# Patient Record
Sex: Female | Born: 1950 | Race: White | Hispanic: No | Marital: Married | Smoking: Former smoker
Health system: Southern US, Community
[De-identification: ages and names within clinical notes are randomized; demographics above are authoritative.]

## PROBLEM LIST (undated history)

## (undated) DIAGNOSIS — F329 Major depressive disorder, single episode, unspecified: Secondary | ICD-10-CM

## (undated) DIAGNOSIS — F32A Depression, unspecified: Secondary | ICD-10-CM

## (undated) DIAGNOSIS — E538 Deficiency of other specified B group vitamins: Secondary | ICD-10-CM

## (undated) DIAGNOSIS — E559 Vitamin D deficiency, unspecified: Secondary | ICD-10-CM

## (undated) DIAGNOSIS — M1990 Unspecified osteoarthritis, unspecified site: Secondary | ICD-10-CM

## (undated) DIAGNOSIS — W19XXXA Unspecified fall, initial encounter: Secondary | ICD-10-CM

## (undated) DIAGNOSIS — G43009 Migraine without aura, not intractable, without status migrainosus: Secondary | ICD-10-CM

## (undated) DIAGNOSIS — R269 Unspecified abnormalities of gait and mobility: Secondary | ICD-10-CM

## (undated) DIAGNOSIS — Z860100 Personal history of colon polyps, unspecified: Secondary | ICD-10-CM

## (undated) DIAGNOSIS — E785 Hyperlipidemia, unspecified: Secondary | ICD-10-CM

## (undated) DIAGNOSIS — F419 Anxiety disorder, unspecified: Secondary | ICD-10-CM

## (undated) DIAGNOSIS — I10 Essential (primary) hypertension: Secondary | ICD-10-CM

## (undated) DIAGNOSIS — K219 Gastro-esophageal reflux disease without esophagitis: Secondary | ICD-10-CM

## (undated) DIAGNOSIS — J449 Chronic obstructive pulmonary disease, unspecified: Secondary | ICD-10-CM

## (undated) DIAGNOSIS — G458 Other transient cerebral ischemic attacks and related syndromes: Secondary | ICD-10-CM

## (undated) DIAGNOSIS — Z800 Family history of malignant neoplasm of digestive organs: Secondary | ICD-10-CM

## (undated) DIAGNOSIS — R42 Dizziness and giddiness: Secondary | ICD-10-CM

## (undated) DIAGNOSIS — I671 Cerebral aneurysm, nonruptured: Secondary | ICD-10-CM

## (undated) HISTORY — DX: Personal history of colon polyps, unspecified: Z86.0100

## (undated) HISTORY — DX: Hyperlipidemia, unspecified: E78.5

## (undated) HISTORY — PX: KNEE ARTHROSCOPY: SHX127

## (undated) HISTORY — DX: Essential (primary) hypertension: I10

## (undated) HISTORY — DX: Unspecified fall, initial encounter: W19.XXXA

## (undated) HISTORY — PX: BRAIN SURGERY: SHX531

## (undated) HISTORY — DX: Gastro-esophageal reflux disease without esophagitis: K21.9

## (undated) HISTORY — DX: Family history of malignant neoplasm of digestive organs: Z80.0

## (undated) HISTORY — DX: Deficiency of other specified B group vitamins: E53.8

## (undated) HISTORY — DX: Personal history of colonic polyps: Z86.010

## (undated) HISTORY — DX: Migraine without aura, not intractable, without status migrainosus: G43.009

## (undated) HISTORY — DX: Unspecified abnormalities of gait and mobility: R26.9

## (undated) HISTORY — DX: Unspecified osteoarthritis, unspecified site: M19.90

## (undated) HISTORY — DX: Vitamin D deficiency, unspecified: E55.9

## (undated) HISTORY — DX: Dizziness and giddiness: R42

## (undated) HISTORY — DX: Cerebral aneurysm, nonruptured: I67.1

## (undated) HISTORY — DX: Other transient cerebral ischemic attacks and related syndromes: G45.8

---
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
## 1985-08-26 HISTORY — PX: TUBAL LIGATION: SHX77

## 1999-07-06 ENCOUNTER — Ambulatory Visit (HOSPITAL_COMMUNITY): Admission: RE | Admit: 1999-07-06 | Discharge: 1999-07-06 | Payer: Self-pay | Admitting: Cardiology

## 2005-12-16 ENCOUNTER — Ambulatory Visit (HOSPITAL_BASED_OUTPATIENT_CLINIC_OR_DEPARTMENT_OTHER): Admission: RE | Admit: 2005-12-16 | Discharge: 2005-12-16 | Payer: Self-pay | Admitting: Orthopedic Surgery

## 2006-08-26 HISTORY — PX: SHOULDER SURGERY: SHX246

## 2007-08-27 HISTORY — PX: WRIST SURGERY: SHX841

## 2007-08-27 HISTORY — PX: OTHER SURGICAL HISTORY: SHX169

## 2007-11-06 ENCOUNTER — Ambulatory Visit (HOSPITAL_BASED_OUTPATIENT_CLINIC_OR_DEPARTMENT_OTHER): Admission: RE | Admit: 2007-11-06 | Discharge: 2007-11-06 | Payer: Self-pay | Admitting: Orthopedic Surgery

## 2009-06-05 ENCOUNTER — Encounter: Payer: Self-pay | Admitting: Unknown Physician Specialty

## 2009-06-22 ENCOUNTER — Encounter: Admission: RE | Admit: 2009-06-22 | Discharge: 2009-06-22 | Payer: Self-pay | Admitting: Otolaryngology

## 2009-06-26 ENCOUNTER — Ambulatory Visit (HOSPITAL_COMMUNITY): Admission: RE | Admit: 2009-06-26 | Discharge: 2009-06-26 | Payer: Self-pay | Admitting: Interventional Radiology

## 2009-07-17 ENCOUNTER — Ambulatory Visit (HOSPITAL_COMMUNITY): Admission: RE | Admit: 2009-07-17 | Discharge: 2009-07-17 | Payer: Self-pay | Admitting: Interventional Radiology

## 2009-08-14 ENCOUNTER — Ambulatory Visit (HOSPITAL_COMMUNITY): Admission: RE | Admit: 2009-08-14 | Discharge: 2009-08-14 | Payer: Self-pay | Admitting: Interventional Radiology

## 2009-09-06 ENCOUNTER — Inpatient Hospital Stay (HOSPITAL_COMMUNITY): Admission: RE | Admit: 2009-09-06 | Discharge: 2009-09-06 | Payer: Self-pay | Admitting: Interventional Radiology

## 2009-09-20 ENCOUNTER — Encounter: Payer: Self-pay | Admitting: Interventional Radiology

## 2009-10-31 ENCOUNTER — Ambulatory Visit (HOSPITAL_COMMUNITY): Admission: RE | Admit: 2009-10-31 | Discharge: 2009-10-31 | Payer: Self-pay | Admitting: Interventional Radiology

## 2010-09-16 ENCOUNTER — Encounter: Payer: Self-pay | Admitting: Otolaryngology

## 2010-09-16 ENCOUNTER — Encounter: Payer: Self-pay | Admitting: Interventional Radiology

## 2010-11-11 LAB — BASIC METABOLIC PANEL
BUN: 13 mg/dL (ref 6–23)
Chloride: 103 mEq/L (ref 96–112)
GFR calc non Af Amer: >60 mL/min (ref >60)
Glucose, Bld: 95 mg/dL (ref 70–99)
Potassium: 3.8 mEq/L (ref 3.5–5.1)
Sodium: 139 mEq/L (ref 135–145)

## 2010-11-11 LAB — APTT: aPTT: 25 seconds (ref 24–37)

## 2010-11-11 LAB — DIFFERENTIAL
Eosinophils Relative: 11 % — ABNORMAL HIGH (ref 0–5)
Lymphocytes Relative: 30 % (ref 12–46)
Monocytes Absolute: 0.4 10*3/uL (ref 0.1–1.0)
Monocytes Relative: 7 % (ref 3–12)
Neutro Abs: 2.8 10*3/uL (ref 1.7–7.7)

## 2010-11-11 LAB — BRAIN NATRIURETIC PEPTIDE: Pro B Natriuretic peptide (BNP): <30 pg/mL (ref 0.0–100.0)

## 2010-11-11 LAB — CBC
HCT: 37.9 % (ref 36.0–46.0)
Hemoglobin: 13.2 g/dL (ref 12.0–15.0)
MCHC: 34.7 g/dL (ref 30.0–36.0)
RBC: 3.99 MIL/uL (ref 3.87–5.11)

## 2010-11-18 LAB — CBC
HCT: 40.5 % (ref 36.0–46.0)
Hemoglobin: 13.9 g/dL (ref 12.0–15.0)
MCV: 95.2 fL (ref 78.0–100.0)
RBC: 4.26 MIL/uL (ref 3.87–5.11)
WBC: 6.8 10*3/uL (ref 4.0–10.5)

## 2010-11-18 LAB — DIFFERENTIAL
Eosinophils Absolute: 0.8 10*3/uL — ABNORMAL HIGH (ref 0.0–0.7)
Eosinophils Relative: 11 % — ABNORMAL HIGH (ref 0–5)
Lymphs Abs: 2.6 10*3/uL (ref 0.7–4.0)
Monocytes Absolute: 0.5 10*3/uL (ref 0.1–1.0)
Monocytes Relative: 7 % (ref 3–12)

## 2010-11-18 LAB — BASIC METABOLIC PANEL
CO2: 27 mEq/L (ref 19–32)
Chloride: 104 mEq/L (ref 96–112)
GFR calc Af Amer: >60 mL/min (ref >60)
Potassium: 3.7 mEq/L (ref 3.5–5.1)

## 2010-11-18 LAB — PROTIME-INR: INR: 1.01 (ref 0.00–1.49)

## 2010-11-26 LAB — DIFFERENTIAL
Basophils Relative: 1 % (ref 0–1)
Lymphocytes Relative: 33 % (ref 12–46)
Monocytes Absolute: 0.6 10*3/uL (ref 0.1–1.0)
Monocytes Relative: 9 % (ref 3–12)
Neutro Abs: 3.2 10*3/uL (ref 1.7–7.7)

## 2010-11-26 LAB — BASIC METABOLIC PANEL
CO2: 29 mEq/L (ref 19–32)
Chloride: 107 mEq/L (ref 96–112)
GFR calc non Af Amer: >60 mL/min (ref >60)
Glucose, Bld: 97 mg/dL (ref 70–99)
Potassium: 3.5 mEq/L (ref 3.5–5.1)
Sodium: 142 mEq/L (ref 135–145)

## 2010-11-26 LAB — APTT: aPTT: 25 seconds (ref 24–37)

## 2010-11-26 LAB — CBC
HCT: 38.6 % (ref 36.0–46.0)
Hemoglobin: 13.1 g/dL (ref 12.0–15.0)
MCHC: 34.1 g/dL (ref 30.0–36.0)
RBC: 4.04 MIL/uL (ref 3.87–5.11)

## 2010-11-28 LAB — BASIC METABOLIC PANEL
CO2: 26 mEq/L (ref 19–32)
Calcium: 9.4 mg/dL (ref 8.4–10.5)
Chloride: 108 mEq/L (ref 96–112)
Creatinine, Ser: 0.94 mg/dL (ref 0.4–1.2)
GFR calc Af Amer: >60 mL/min (ref >60)
GFR calc Af Amer: >60 mL/min (ref >60)
GFR calc non Af Amer: >60 mL/min (ref >60)
Potassium: 3.6 mEq/L (ref 3.5–5.1)
Sodium: 140 mEq/L (ref 135–145)

## 2010-11-28 LAB — DIFFERENTIAL
Eosinophils Absolute: 0.4 10*3/uL (ref 0.0–0.7)
Eosinophils Relative: 6 % — ABNORMAL HIGH (ref 0–5)
Lymphocytes Relative: 37 % (ref 12–46)
Lymphocytes Relative: 27 % (ref 12–46)
Lymphs Abs: 2.7 10*3/uL (ref 0.7–4.0)
Lymphs Abs: 2.6 10*3/uL (ref 0.7–4.0)
Monocytes Absolute: 0.5 10*3/uL (ref 0.1–1.0)
Monocytes Relative: 6 % (ref 3–12)
Neutro Abs: 6.1 10*3/uL (ref 1.7–7.7)
Neutrophils Relative %: 63 % (ref 43–77)

## 2010-11-28 LAB — BRAIN NATRIURETIC PEPTIDE
Pro B Natriuretic peptide (BNP): <30 pg/mL (ref 0.0–100.0)
Pro B Natriuretic peptide (BNP): <30 pg/mL (ref 0.0–100.0)

## 2010-11-28 LAB — PROTIME-INR
INR: 1.11 (ref 0.00–1.49)
Prothrombin Time: 14.2 seconds (ref 11.6–15.2)

## 2010-11-28 LAB — CBC
HCT: 38.7 % (ref 36.0–46.0)
Hemoglobin: 13.6 g/dL (ref 12.0–15.0)
Platelets: 326 10*3/uL (ref 150–400)
RBC: 3.87 MIL/uL (ref 3.87–5.11)
WBC: 7.3 10*3/uL (ref 4.0–10.5)
WBC: 9.7 10*3/uL (ref 4.0–10.5)

## 2010-11-28 LAB — APTT: aPTT: 27 seconds (ref 24–37)

## 2010-12-25 ENCOUNTER — Other Ambulatory Visit (HOSPITAL_COMMUNITY): Payer: Self-pay | Admitting: Interventional Radiology

## 2010-12-25 DIAGNOSIS — I729 Aneurysm of unspecified site: Secondary | ICD-10-CM

## 2011-01-08 NOTE — Op Note (Signed)
NAME:  Amy Glover, Amy Glover               ACCOUNT NO.:  [id number]   MEDICAL RECORD NO.:  [id number]          PATIENT TYPE:  AMB   LOCATION:  DSC                          FACILITY:  [facility]   PHYSICIAN:  Eulas Post, MD    DATE OF BIRTH:  [birth year]   DATE OF PROCEDURE:  11/06/2007  DATE OF DISCHARGE:                               OPERATIVE REPORT   ATTENDING PHYSICIAN:  Eulas Post, MD   PREOPERATIVE DIAGNOSIS:  Right distal radius fracture.   POSTOPERATIVE DIAGNOSIS:  Right distal radius fracture.   OPERATIVE PROCEDURE:  Open reduction internal fixation of the right  distal radius fracture with a total of three fragments.   ANESTHESIA:  Anesthesia is general.   TOURNIQUET TIME:  Tourniquet time was 80 minutes.   ESTIMATED BLOOD LOSS:  Estimated blood loss was minimal.   OPERATIVE IMPLANTS:  [employer] plate with two proximal screws and  screws in the proximal row of the distal head of the plate only.   PREOPERATIVE INDICATIONS:  Ms. Amy Glover is a 60 year old woman who  fell and broke her right wrist.  She recently had adhesive capsulitis of  the left shoulder and had a manipulation.  We discussed the risks,  benefits and alternatives to operative versus nonoperative care.  She  had a displaced distal radius fracture.  In order to minimize the risks  of post-traumatic arthritis and malunion she elected to have surgery.  We discussed percutaneous pinning versus ORIF.  Given her recent bout of  adhesive capsulitis and her tendency towards stiffness, we elected to  perform an open reduction internal fixation in order to optimize her  early motion and reduce the risk of post traumatic stiffness.  The  risks, benefits and alternatives were discussed with her preoperatively  including but not limited to the risks of infection, bleeding, nerve  injury, malunion, nonunion, hardware prominence, hardware failure,  tendon rupture, post-traumatic arthritis, cardiopulmonary  complications,  among others.  She was willing to proceed.   OPERATIVE FINDINGS:  The radial styloid was fractured and dorsally  displaced.  There was a larger ulnar piece.  We were able to restore  appropriate congruity.  The plate had excellent congruity with the bone  and we had an excellent hold on the radial styloid using a radial screw.  The plate fit best slightly distal to its normal position, therefore, we  filled the proximal holes and did not fill the distal holes.  Live  fluoroscopy was used to confirm that there were no screws left in the  joint.   PROCEDURE IN DETAIL:  The patient was brought to the operating room and  placed in the supine position.  One gram of intravenous Ancef was given.  The right upper extremity was prepped and draped in the usual sterile  fashion.  General anesthesia was administered.  A volar approach to the  wrist was performed.  Fracture fragments were reduced and held with K-  wires.  The plate was applied and then the radial styloid screw was  placed.  The proximal holes were also used.  The  middle hole of the  proximal portion of the plate had very poor bite for some reason,  therefore we did not use that hole.  The other two screws had excellent  hold.  We were able to restore near anatomic congruity to the volar tilt  of the radius and secured it with a combination of pegs and screws.   We then irrigated the wound copiously and closed the pronator quadratus  using 3-0 Vicryl followed by 3-0 for the subcutaneous tissue followed by  Monocryl for the skin.  Steri-Strips were placed.  Sterile gauze was  also placed and a short-arm splint.  The patient was awakened, returned  to the PACU in stable and satisfactory condition.  She is going to come  back to see [place] in 2 weeks.  We will get x-rays out of plaster at that  time and hopefully allow her to begin doing some early motion.      Eulas Post, MD  Electronically Signed      JPL/MEDQ  D:  11/06/2007  T:  11/07/2007  Job:  [id number]

## 2011-01-11 NOTE — Op Note (Signed)
NAMEGREGG, HOLSTER               ACCOUNT NO.:  [id number]   MEDICAL RECORD NO.:  [id number]          PATIENT TYPE:  AMB   LOCATION:  DSC                          FACILITY:  [facility]   PHYSICIAN:  Robert A. Thurston Hole, M.D. DATE OF BIRTH:  19-Jul-1951   DATE OF PROCEDURE:  12/16/2005  DATE OF DISCHARGE:                                 OPERATIVE REPORT   PREOPERATIVE DIAGNOSES:  1.  Right knee medial and lateral meniscal tears.  2.  Right knee chondromalacia.  3.  Right knee loose body.  4.  Right knee anterior compartment ganglion cyst.   POSTOPERATIVE DIAGNOSES:  1.  Right knee medial and lateral meniscal tears.  2.  Right knee chondromalacia.  3.  Right knee loose body.  4.  Right knee anterior compartment ganglion cyst.   PROCEDURES:  1.  Right knee EUA, followed by arthroscopic partial medial and lateral      meniscectomies.  2.  Right knee loose body excision.  3.  Right knee ganglion cyst debridement and excision.  4.  Right knee chondroplasty.   SURGEON:  Elana Alm. Thurston Hole, M.D.   ASSISTANT:  Aura Fey. Bobbe Medico.   ANESTHESIA:  General.   OPERATIVE TIME:  45 minutes.   COMPLICATIONS:  None.   INDICATIONS FOR PROCEDURE:  Ms. Janowski is a 54-year woman who has had  significant right knee pain for the past month, getting progressively worse  with locking and catching, with exam and MRI documenting lateral meniscus  tear with loose body chondromalacia and an anterior compartment ganglion  cyst. She has failed conservative care and is now to undergo arthroscopy.   DESCRIPTION:  Ms. Oguinn was brought to operating room on December 16, 2005  and placed on the operative table in the supine position. After an adequate  level general anesthesia was obtained, her right knee was examined. She had  full range of motion, and her knee was stable. Ligamentous exam with normal  patellar tracking. The knee was sterilely injected with 0.25% Marcaine with  epinephrine. She received Ancef 1  g IV preoperatively for prophylaxis. Her  right leg was then prepped using sterile DuraPrep and draped using sterile  technique. Originally through an anterolateral portal, the arthroscope with  a pump attached was placed into an anteromedial portal.  An arthroscopic  probe was placed. On initial inspection of the medial compartment, she had  25% grade 3 chondromalacia which was debrided. Medial meniscus tear, 20%,  posteromedial horn, which was resected back to stable rim. The intercondylar  notch was inspected. Anterior and posterior cruciate ligaments were normal.  There was a thickened ganglion-type cystic material noted in the anterior  compartment in the fat pad, and this was thoroughly debrided and resected  with a shaver. Lateral compartment was inspected. She had 75% grade 3  chondromalacia, which was debrided. She had a bucket-handle type tear of the  posterolateral horn of the lateral meniscus, of which 75% was resected back  to a stable rim. Patellofemoral joint inspected, 30-40% grade 3  chondromalacia on the patellofemoral groove, and this was debrided. Moderate  synovitis  in the medial and lateral gutters was debrided. A lateral gutter  loose body was noted. It was removed. It measured 1 x 1 cm. No other loose  bodies or pathology was noted in the medial and lateral compartments. At  this point, it was felt that all the pathology had been satisfactorily  addressed. The instruments were removed. Portals were closed with 3-0 nylon  suture and injected with 0.25% Marcaine with epinephrine and 4 mg of  morphine. Sterile dressings were applied, and the patient awakened and taken  to the recovery room in a stable condition.   FOLLOW-UP CARE:  She will be treated as an outpatient on Vicodin and  ibuprofen. She will be back in the office in a week for sutures out and  followup.      Robert A. Thurston Hole, M.D.  Electronically Signed     RAW/MEDQ  D:  12/16/2005  T:  12/17/2005   Job:  [id number]

## 2011-01-15 ENCOUNTER — Other Ambulatory Visit (HOSPITAL_COMMUNITY): Payer: Self-pay | Admitting: Interventional Radiology

## 2011-01-15 ENCOUNTER — Ambulatory Visit (HOSPITAL_COMMUNITY)
Admission: RE | Admit: 2011-01-15 | Discharge: 2011-01-15 | Disposition: A | Discharge status: Home / Self Care | Payer: PRIVATE HEALTH INSURANCE | Source: Ambulatory Visit | Attending: Interventional Radiology | Admitting: Interventional Radiology

## 2011-01-15 DIAGNOSIS — G43909 Migraine, unspecified, not intractable, without status migrainosus: Secondary | ICD-10-CM | POA: Insufficient documentation

## 2011-01-15 DIAGNOSIS — R42 Dizziness and giddiness: Secondary | ICD-10-CM | POA: Insufficient documentation

## 2011-01-15 DIAGNOSIS — I10 Essential (primary) hypertension: Secondary | ICD-10-CM | POA: Insufficient documentation

## 2011-01-15 DIAGNOSIS — M1990 Unspecified osteoarthritis, unspecified site: Secondary | ICD-10-CM | POA: Insufficient documentation

## 2011-01-15 DIAGNOSIS — I428 Other cardiomyopathies: Secondary | ICD-10-CM | POA: Insufficient documentation

## 2011-01-15 DIAGNOSIS — K219 Gastro-esophageal reflux disease without esophagitis: Secondary | ICD-10-CM | POA: Insufficient documentation

## 2011-01-15 DIAGNOSIS — I671 Cerebral aneurysm, nonruptured: Secondary | ICD-10-CM | POA: Insufficient documentation

## 2011-01-15 DIAGNOSIS — I729 Aneurysm of unspecified site: Secondary | ICD-10-CM

## 2011-01-15 LAB — POCT I-STAT, CHEM 8
Creatinine, Ser: 1.2 mg/dL (ref 0.4–1.2)
Glucose, Bld: 96 mg/dL (ref 70–99)
HCT: 39 % (ref 36.0–46.0)
Hemoglobin: 13.3 g/dL (ref 12.0–15.0)
Potassium: 3.3 mEq/L — ABNORMAL LOW (ref 3.5–5.1)
Sodium: 142 mEq/L (ref 135–145)
TCO2: 25 mmol/L (ref 0–100)

## 2011-01-15 LAB — CBC
Hemoglobin: 12.6 g/dL (ref 12.0–15.0)
MCH: 31.4 pg (ref 26.0–34.0)
Platelets: 291 10*3/uL (ref 150–400)
RBC: 4.01 MIL/uL (ref 3.87–5.11)

## 2011-01-15 LAB — PROTIME-INR
INR: 0.97 (ref 0.00–1.49)
Prothrombin Time: 13.1 seconds (ref 11.6–15.2)

## 2011-01-15 MED ORDER — IOHEXOL 300 MG/ML  SOLN
150.0000 mL | Freq: Once | INTRAMUSCULAR | Status: AC | PRN
  Administered 2011-01-15: 65 mL via INTRAVENOUS

## 2011-05-20 LAB — POCT I-STAT, CHEM 8
Calcium, Ion: 1.15
Glucose, Bld: 100 — ABNORMAL HIGH
HCT: 41
Hemoglobin: 13.9
Potassium: 4

## 2011-12-17 ENCOUNTER — Telehealth (HOSPITAL_COMMUNITY): Payer: Self-pay

## 2011-12-17 ENCOUNTER — Other Ambulatory Visit (HOSPITAL_COMMUNITY): Payer: Self-pay | Admitting: Interventional Radiology

## 2011-12-17 DIAGNOSIS — I729 Aneurysm of unspecified site: Secondary | ICD-10-CM

## 2011-12-17 NOTE — Telephone Encounter (Signed)
Spoke with Mr. Fazzino about scheduling Mrs. Amy Glover f/u angio appt.  He stated that he would have her call to schedule.

## 2012-01-03 ENCOUNTER — Encounter (HOSPITAL_COMMUNITY): Payer: Self-pay | Admitting: Pharmacy Technician

## 2012-01-03 ENCOUNTER — Other Ambulatory Visit: Payer: Self-pay | Admitting: Radiology

## 2012-01-10 ENCOUNTER — Ambulatory Visit (HOSPITAL_COMMUNITY)
Admission: RE | Admit: 2012-01-10 | Discharge: 2012-01-10 | Disposition: A | Discharge status: Home / Self Care | Payer: PRIVATE HEALTH INSURANCE | Source: Ambulatory Visit | Attending: Interventional Radiology | Admitting: Interventional Radiology

## 2012-01-10 ENCOUNTER — Encounter (HOSPITAL_COMMUNITY): Payer: Self-pay

## 2012-01-10 ENCOUNTER — Other Ambulatory Visit (HOSPITAL_COMMUNITY): Payer: Self-pay | Admitting: Interventional Radiology

## 2012-01-10 DIAGNOSIS — I729 Aneurysm of unspecified site: Secondary | ICD-10-CM

## 2012-01-10 DIAGNOSIS — R209 Unspecified disturbances of skin sensation: Secondary | ICD-10-CM | POA: Insufficient documentation

## 2012-01-10 DIAGNOSIS — I671 Cerebral aneurysm, nonruptured: Secondary | ICD-10-CM | POA: Insufficient documentation

## 2012-01-10 DIAGNOSIS — R51 Headache: Secondary | ICD-10-CM | POA: Insufficient documentation

## 2012-01-10 DIAGNOSIS — E785 Hyperlipidemia, unspecified: Secondary | ICD-10-CM | POA: Insufficient documentation

## 2012-01-10 DIAGNOSIS — I10 Essential (primary) hypertension: Secondary | ICD-10-CM | POA: Insufficient documentation

## 2012-01-10 LAB — DIFFERENTIAL
Basophils Relative: 1 % (ref 0–1)
Lymphs Abs: 3 10*3/uL (ref 0.7–4.0)
Monocytes Relative: 8 % (ref 3–12)
Neutro Abs: 2.1 10*3/uL (ref 1.7–7.7)
Neutrophils Relative %: 34 % — ABNORMAL LOW (ref 43–77)

## 2012-01-10 LAB — CBC
Hemoglobin: 13.3 g/dL (ref 12.0–15.0)
MCHC: 34.3 g/dL (ref 30.0–36.0)
Platelets: 265 10*3/uL (ref 150–400)
RBC: 4.09 MIL/uL (ref 3.87–5.11)

## 2012-01-10 LAB — PROTIME-INR
INR: 0.91 (ref 0.00–1.49)
Prothrombin Time: 12.4 seconds (ref 11.6–15.2)

## 2012-01-10 LAB — BASIC METABOLIC PANEL
GFR calc Af Amer: 84 mL/min — ABNORMAL LOW (ref >90)
GFR calc non Af Amer: 73 mL/min — ABNORMAL LOW (ref >90)
Potassium: 4 mEq/L (ref 3.5–5.1)
Sodium: 143 mEq/L (ref 135–145)

## 2012-01-10 MED ORDER — SODIUM CHLORIDE 0.9 % IV SOLN
Freq: Once | INTRAVENOUS | Status: AC
  Administered 2012-01-10: 07:00:00 via INTRAVENOUS

## 2012-01-10 MED ORDER — FENTANYL CITRATE 0.05 MG/ML IJ SOLN
INTRAMUSCULAR | Status: AC | PRN
  Administered 2012-01-10 (×2): 25 ug via INTRAVENOUS

## 2012-01-10 MED ORDER — IOHEXOL 300 MG/ML  SOLN
150.0000 mL | Freq: Once | INTRAMUSCULAR | Status: AC | PRN
  Administered 2012-01-10: 50 mL via INTRA_ARTERIAL

## 2012-01-10 MED ORDER — HYDRALAZINE HCL 20 MG/ML IJ SOLN
INTRAMUSCULAR | Status: AC
  Filled 2012-01-10: qty 1

## 2012-01-10 MED ORDER — MIDAZOLAM HCL 2 MG/2ML IJ SOLN
INTRAMUSCULAR | Status: AC
  Filled 2012-01-10: qty 4

## 2012-01-10 MED ORDER — HEPARIN SOD (PORK) LOCK FLUSH 100 UNIT/ML IV SOLN
INTRAVENOUS | Status: AC | PRN
  Administered 2012-01-10 (×2): 500 [IU] via INTRAVENOUS

## 2012-01-10 MED ORDER — HYDROCODONE-ACETAMINOPHEN 5-325 MG PO TABS
1.0000 | ORAL_TABLET | ORAL | Status: DC | PRN
  Administered 2012-01-10: 1 via ORAL
  Filled 2012-01-10: qty 1

## 2012-01-10 MED ORDER — MIDAZOLAM HCL 5 MG/5ML IJ SOLN
INTRAMUSCULAR | Status: AC | PRN
  Administered 2012-01-10 (×2): 1 mg via INTRAVENOUS

## 2012-01-10 MED ORDER — FENTANYL CITRATE 0.05 MG/ML IJ SOLN
INTRAMUSCULAR | Status: AC
  Filled 2012-01-10: qty 4

## 2012-01-10 NOTE — Discharge Instructions (Signed)

## 2012-01-10 NOTE — H&P (Signed)
Chief Complaint: Hx ACA aneurysm HPI: Amy Glover is an 61 y.o. female who has had open clipping of ACA aneurysm in [place]. She has had follow up angiograms, most recently last May, showing stability of this. She is here for her annual follow up angio.  No new c/o. Taking ASA, no recent TIA< CVA sxs. No new headaches.  Past Medical History: ACA aneurysm, HTN, hyperlipidemia, allergic rhinitis  Past Surgical History: Open ACA aneurysm clipping  Family History: No family history on file.  Social History:No tobacco use.  Allergies:  Allergies  Allergen Reactions  . Sulfa Antibiotics Hives    Medications: Xanax, Coreg, ASA 81, Lasix, Allegra, Prevacid, Pravachol, Prinivil, Ambien, Kdur, MVI, Os Cal.  Please HPI for pertinent positives, otherwise complete 10 system ROS negative.  Physical Exam: Blood pressure 122/66, pulse 83, temperature 97.7 F (36.5 C), temperature source Oral, resp. rate 18, height 5\' 4"  (1.626 m), weight 178 lb (80.74 kg), SpO2 96.00%. Body mass index is 30.55 kg/(m^2).   General Appearance:  Alert, cooperative, no distress, appears stated age  Head:  Normocephalic, without obvious abnormality, atraumatic  ENT: Unremarkable  Neck: Supple, symmetrical, trachea midline, no adenopathy, thyroid: not enlarged, symmetric, no tenderness/mass/nodules  Lungs:   Clear to auscultation bilaterally, no w/r/r, respirations unlabored without use of accessory muscles.  Heart:  Regular rate and rhythm, S1, S2 normal, no murmur, rub or gallop. Carotids 2+ without bruit.  Abdomen:   Soft, non-tender, non distended. Bowel sounds active all four quadrants,  no masses, no organomegaly.  Extremities: Extremities normal, atraumatic, no cyanosis or edema  Pulses: 2+ and symmetric  Neurologic: Normal affect, no gross deficits.   Results for orders placed during the hospital encounter of 01/10/12 (from the past 48 hour(s))  APTT     Status: Normal   Collection Time   01/10/12  7:13  AM      Component Value Range Comment   aPTT 24  24 - 37 (seconds)   BASIC METABOLIC PANEL     Status: Abnormal   Collection Time   01/10/12  7:13 AM      Component Value Range Comment   Sodium 143  135 - 145 (mEq/L)    Potassium 4.0  3.5 - 5.1 (mEq/L)    Chloride 105  96 - 112 (mEq/L)    CO2 27  19 - 32 (mEq/L)    Glucose, Bld 111 (*) 70 - 99 (mg/dL)    BUN 23  6 - 23 (mg/dL)    Creatinine, Ser 1.61  0.50 - 1.10 (mg/dL)    Calcium 9.9  8.4 - 10.5 (mg/dL)    GFR calc non Af Amer 73 (*) >90 (mL/min)    GFR calc Af Amer 84 (*) >90 (mL/min)   CBC     Status: Normal   Collection Time   01/10/12  7:13 AM      Component Value Range Comment   WBC 6.3  4.0 - 10.5 (K/uL)    RBC 4.09  3.87 - 5.11 (MIL/uL)    Hemoglobin 13.3  12.0 - 15.0 (g/dL)    HCT 09.6  04.5 - 40.9 (%)    MCV 94.9  78.0 - 100.0 (fL)    MCH 32.5  26.0 - 34.0 (pg)    MCHC 34.3  30.0 - 36.0 (g/dL)    RDW 81.1  91.4 - 78.2 (%)    Platelets 265  150 - 400 (K/uL)   DIFFERENTIAL     Status: Abnormal  Collection Time   01/10/12  7:13 AM      Component Value Range Comment   Neutrophils Relative 34 (*) 43 - 77 (%)    Neutro Abs 2.1  1.7 - 7.7 (K/uL)    Lymphocytes Relative 48 (*) 12 - 46 (%)    Lymphs Abs 3.0  0.7 - 4.0 (K/uL)    Monocytes Relative 8  3 - 12 (%)    Monocytes Absolute 0.5  0.1 - 1.0 (K/uL)    Eosinophils Relative 9 (*) 0 - 5 (%)    Eosinophils Absolute 0.6  0.0 - 0.7 (K/uL)    Basophils Relative 1  0 - 1 (%)    Basophils Absolute 0.1  0.0 - 0.1 (K/uL)   PROTIME-INR     Status: Normal   Collection Time   01/10/12  7:13 AM      Component Value Range Comment   Prothrombin Time 12.4  11.6 - 15.2 (seconds)    INR 0.91  0.00 - 1.49     No results found.  Assessment/Plan Hx of ACA aneurysm s/p clipping. For folow up cerebral arteriogram today. Procedure reviewed with pt and husband including risks and complications. Labs reviewed Consent signed,.  Brayton El PA-C 01/10/2012, 8:13 AM

## 2012-01-10 NOTE — Procedures (Signed)
S/P bilateral carotid and Lt vert arteriogram. RT CFA approach. Preliminary findings.. 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

## 2012-01-13 ENCOUNTER — Telehealth (HOSPITAL_COMMUNITY): Payer: Self-pay | Admitting: *Deleted

## 2012-01-13 NOTE — Telephone Encounter (Signed)
Doing well no problems, groin doing well without redness.  Encouraged to call for problems.

## 2012-10-15 DIAGNOSIS — G5700 Lesion of sciatic nerve, unspecified lower limb: Secondary | ICD-10-CM | POA: Insufficient documentation

## 2013-08-26 HISTORY — PX: OTHER SURGICAL HISTORY: SHX169

## 2013-12-03 ENCOUNTER — Encounter: Payer: Self-pay | Admitting: Neurology

## 2013-12-07 ENCOUNTER — Telehealth: Payer: Self-pay | Admitting: Neurology

## 2013-12-07 ENCOUNTER — Ambulatory Visit (INDEPENDENT_AMBULATORY_CARE_PROVIDER_SITE_OTHER): Payer: Medicare Other | Admitting: Radiology

## 2013-12-07 ENCOUNTER — Ambulatory Visit (INDEPENDENT_AMBULATORY_CARE_PROVIDER_SITE_OTHER): Payer: Medicare Other

## 2013-12-07 ENCOUNTER — Encounter (INDEPENDENT_AMBULATORY_CARE_PROVIDER_SITE_OTHER): Payer: Self-pay

## 2013-12-07 ENCOUNTER — Ambulatory Visit (INDEPENDENT_AMBULATORY_CARE_PROVIDER_SITE_OTHER): Payer: Medicare Other | Admitting: Neurology

## 2013-12-07 ENCOUNTER — Encounter: Payer: Self-pay | Admitting: Neurology

## 2013-12-07 VITALS — BP 108/70 | HR 68 | Ht 63.5 in | Wt 196.0 lb

## 2013-12-07 DIAGNOSIS — G43009 Migraine without aura, not intractable, without status migrainosus: Secondary | ICD-10-CM

## 2013-12-07 DIAGNOSIS — R42 Dizziness and giddiness: Secondary | ICD-10-CM

## 2013-12-07 HISTORY — DX: Migraine without aura, not intractable, without status migrainosus: G43.009

## 2013-12-07 HISTORY — DX: Dizziness and giddiness: R42

## 2013-12-07 NOTE — Patient Instructions (Signed)
Vertigo Vertigo means you feel like you or your surroundings are moving when they are not. Vertigo can be dangerous if it occurs when you are at work, driving, or performing difficult activities.  CAUSES  Vertigo occurs when there is a conflict of signals sent to your brain from the visual and sensory systems in your body. There are many different causes of vertigo, including:  Infections, especially in the inner ear.  A bad reaction to a drug or misuse of alcohol and medicines.  Withdrawal from drugs or alcohol.  Rapidly changing positions, such as lying down or rolling over in bed.  A migraine headache.  Decreased blood flow to the brain.  Increased pressure in the brain from a head injury, infection, tumor, or bleeding. SYMPTOMS  You may feel as though the world is spinning around or you are falling to the ground. Because your balance is upset, vertigo can cause nausea and vomiting. You may have involuntary eye movements (nystagmus). DIAGNOSIS  Vertigo is usually diagnosed by physical exam. If the cause of your vertigo is unknown, your caregiver may perform imaging tests, such as an MRI scan (magnetic resonance imaging). TREATMENT  Most cases of vertigo resolve on their own, without treatment. Depending on the cause, your caregiver may prescribe certain medicines. If your vertigo is related to body position issues, your caregiver may recommend movements or procedures to correct the problem. In rare cases, if your vertigo is caused by certain inner ear problems, you may need surgery. HOME CARE INSTRUCTIONS   Follow your caregiver's instructions.  Avoid driving.  Avoid operating heavy machinery.  Avoid performing any tasks that would be dangerous to you or others during a vertigo episode.  Tell your caregiver if you notice that certain medicines seem to be causing your vertigo. Some of the medicines used to treat vertigo episodes can actually make them worse in some people. SEEK  IMMEDIATE MEDICAL CARE IF:   Your medicines do not relieve your vertigo or are making it worse.  You develop problems with talking, walking, weakness, or using your arms, hands, or legs.  You develop severe headaches.  Your nausea or vomiting continues or gets worse.  You develop visual changes.  A family member notices behavioral changes.  Your condition gets worse. MAKE SURE YOU:  Understand these instructions.  Will watch your condition.  Will get help right away if you are not doing well or get worse. Document Released: 05/22/2005 Document Revised: 11/04/2011 Document Reviewed: 02/28/2011 [employer] Patient Information 2014 [employer].  

## 2013-12-07 NOTE — Procedures (Signed)
     Amy Glover is a 64 year old patient with a history of dizziness and nausea sensations that have been quite chronic in nature. The episodes have been going on for several years, becoming more frequent. The patient is being evaluated for the dizziness. The patient does report some ringing in the right ear.  Brainstem auditory evoked response test was performed bilaterally using 85 dB rare fraction clicks in the ipsilateral ear, and 40 dB masking noise in the contralateral ear. Hearing thresholds 25 dB AS and 30 dB AD.  Description of study: The absolute latencies for waveforms I, III, and V were within normal limits on the left, with normal interpeak latencies for waveforms I-III, III-V, and I-V on this side. On the right side, waveform I was absent, with a normal absolute latency for waveform III, and prolongation for the absolute latency for waveform V. The interpeak latencies for waveforms I-III and I-V could not be calculated. The interpeak latency for waveform III-V was prolonged.  Impression: The brainstem auditory evoked response test on the left side was within normal limits. No evidence of peripheral or central conduction delay was seen. The study on the right side reveals absence of waveform I suggesting a peripheral conduction abnormality. Clinical correlation is required.

## 2013-12-07 NOTE — Telephone Encounter (Signed)
I called patient. The EEG study showed sharp transients emanating from the right temporal area. The patient also had a brainstem auditory response test showing absence of waveforms 1 on the right. I'll check MRI of the internal auditory canal. If this is unremarkable, I will consider treatment with Keppra or Topamax.

## 2013-12-07 NOTE — Procedures (Signed)
      Amy Glover is a 63 year old patient with a history of a right craniotomy for a aneurysm repair. The patient has had episodes of vertigo and nausea without headache. The patient is being evaluated for these events. The patient reports some issues with memory as well.  This is a routine EEG. A prior right craniotomy is noted. Medications include Xanax, aspirin, Lipitor, calcium carbonate, Coreg, vitamin D, Allegra, Lasix, Prevacid, lisinopril, meclizine, multivitamins, potassium, Phenergan, Viibryd, and Ambien.  EEG classification: Dysrhythmia grade 2 right temporal  Description of the recording: The background rhythms of this recording consists of a fairly well modulated medium amplitude alpha rhythm of 9 Hz that is reactive to eye opening and closure. As the record progresses, photic stimulation is performed, and this results in a bilateral and symmetric photic driving response. Hyperventilation was not performed. Intermittently during the recording, dysrhythmic theta activity is seen emanating from the right temporal region, with occasional sharp transients with phase reversal around the F8 level. No spike or spike wave discharges were seen. EKG monitor shows no evidence of cardiac rhythm abnormalities with a heart rate of 72.  Impression: This is an abnormal EEG recording secondary to dysrhythmic theta activity and sharp transients seen in the right temporal region, suggestive of irritability in this area, with a lowered seizure threshold. No electrographic seizures were recorded.

## 2013-12-07 NOTE — Progress Notes (Signed)
Reason for visit: Vertigo  Amy Glover is a 63 y.o. female  History of present illness:  Amy Glover is a 63 year old right-handed white female with a history of vertigo that began in 2009. The patient had nausea associated with the vertigo and dizziness that would occur on average once a week. The episodes last one to 2 hours at a time. 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 The patient indicates that she underwent coiling and clipping of the aneurysm. The patient has had some memory and concentration issues since the surgery, but this was not present prior to the surgery. The patient has been seen by Dr. Thornell Mule for the vertigo, and she was told she may have migraine equivalent. The patient is sent to Dr. Tommi Rumps for migraine therapy, and use of Topamax, injections, and baclofen did not help. The patient has had some gradual worsening of episodes of dizziness to the point that they are now occurring daily in nature, again lasting an hour to 2 hours. The patient has undergone vestibular rehabilitation without benefit. The patient recently seen by Dr. Polly Cobia and a repeat neurologic evaluation was suggested. The patient indicates that she is on meclizine which does benefit her with the dizziness, and alprazolam seems to help the dizziness slightly as well. The patient denies any headache associated with the episode of dizziness, but she still has some occasional migraine. Migraine headaches have gradually improved, particularly after she went through menopause. The patient has tinnitus in the right ear, she denies any alteration in hearing or in the tinnitus associated with the episodes of vertigo. The patient has no significant hearing loss. 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 Recent cerebral  angiography has shown no problems with this issue. The patient denies any blackout episodes, but she does have some memory problems and slight confusion since the aneurysm surgery. The patient will fall on occasion. The patient is sent to this office for further evaluation.  Past Medical History  Diagnosis Date  . Brain aneurysm     right ACOM  . Dizziness and giddiness 12/07/2013  . Migraine without aura, without mention of intractable migraine without mention of status migrainosus 12/07/2013  . GERD (gastroesophageal reflux disease)   . Subclavian steal syndrome   . Hypertension   . Dyslipidemia     Past Surgical History  Procedure Laterality Date  . Brain surgery  04/20011    right ACOM aneurysm clip  . 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 Wrist surgery Right 2009  . Stent in neck  2009  . Tonsillectomy and adenoidectomy  1961  . Shoulder surgery  2008  . Cesarean section  1984  . Tubal ligation  1987    Family History  Problem Relation Age of Onset  . Heart disease Mother   . Hypertension Father   . Cancer Father   . Multiple sclerosis Sister     Social history:  reports that she has quit smoking. She has never used smokeless tobacco. She reports that she drinks alcohol. She reports that she does not use illicit drugs.  Medications:  Current Outpatient Prescriptions on File Prior to Visit  Medication Sig Dispense Refill  . ALPRAZolam (XANAX) 0.25 MG tablet Take 0.25 mg by mouth 3 (three) times  daily as needed. For anxiety      . aspirin EC 81 MG tablet Take 162 mg by mouth every evening.      . calcium carbonate (OS-CAL - DOSED IN MG OF ELEMENTAL CALCIUM) 1250 MG tablet Take 1 tablet by mouth daily.      . carvedilol (COREG) 6.25 MG tablet Take 6.25 mg by mouth 2 (two) times daily with a meal.      . cholecalciferol (VITAMIN D) 1000 UNITS tablet Take 5,000 Units by mouth daily.      . fexofenadine (ALLEGRA) 180 MG tablet Take 90 mg by mouth at bedtime.      .  furosemide (LASIX) 40 MG tablet Take 40 mg by mouth daily.      . lansoprazole (PREVACID) 15 MG capsule Take 15 mg by mouth 2 (two) times daily.      Marland Kitchen lisinopril (PRINIVIL,ZESTRIL) 5 MG tablet Take 5 mg by mouth daily.      . Multiple Vitamin (MULITIVITAMIN WITH MINERALS) TABS Take 1 tablet by mouth daily.      . potassium chloride SA (K-DUR,KLOR-CON) 20 MEQ tablet Take 40 mEq by mouth 2 (two) times daily.      . promethazine (PHENERGAN) 25 MG tablet Take 25 mg by mouth every 8 (eight) hours as needed. For nausea      . Vilazodone HCl (VIIBRYD) 40 MG TABS Take 40 mg by mouth daily.      Marland Kitchen zolpidem (AMBIEN) 10 MG tablet Take 5 mg by mouth at bedtime as needed. For sleep       No current facility-administered medications on file prior to visit.      Allergies  Allergen Reactions  . Sulfa Antibiotics Hives    ROS:  Out of a complete 14 system review of symptoms, the patient complains only of the following symptoms, and all other reviewed systems are negative.  Blurred vision Weight gain Ringing in the ears Rosacea Seasonal allergies Shortness of breath Feeling hot Memory loss, confusion, headache, numbness, dizziness Anxiety Insomnia, snoring  Blood pressure 108/70, pulse 68, height 5' 3.5" (1.613 m), weight 196 lb (88.905 kg).  Physical Exam  General: The patient is alert and cooperative at the time of the examination. The patient is moderately obese.  Eyes: Pupils are equal, round, and reactive to light. Discs are flat bilaterally.  Neck: The neck is supple, no carotid bruits are noted. Tympanic membranes are clear bilaterally.  Respiratory: The respiratory examination is clear.  Cardiovascular: The cardiovascular examination reveals a regular rate and rhythm, no obvious murmurs or rubs are noted.  Skin: Extremities are without significant edema.  Neurologic Exam  Mental status: The patient is alert and oriented x 3 at the time of the examination. The patient has  apparent normal recent and remote memory, with an apparently normal attention span and concentration ability.  Cranial nerves: Facial symmetry is present. There is good sensation of the face to pinprick and soft touch bilaterally. The strength of the facial muscles and the muscles to head turning and shoulder shrug are normal bilaterally. Speech is well enunciated, no aphasia or dysarthria is noted. Extraocular movements are full. Visual fields are full. The tongue is midline, and the patient has symmetric elevation of the soft palate. No obvious hearing deficits are noted.  Motor: The motor testing reveals 5 over 5 strength of all 4 extremities. Good symmetric motor tone is noted throughout.  Sensory: Sensory testing is intact to pinprick, soft touch, vibration sensation, and position  sense on all 4 extremities. No evidence of extinction is noted.  Coordination: Cerebellar testing reveals good finger-nose-finger and heel-to-shin bilaterally. The Nyan-Barrany procedure revealed bilateral, symmetric horizontal and rotatory nystagmus on end gaze bilaterally. This did not alter with head position. No subjective vertigo is noted.  Gait and station: Gait is normal. Tandem gait is normal. Romberg is negative. No drift is seen.  Reflexes: Deep tendon reflexes are symmetric and normal bilaterally. Toes are downgoing bilaterally.   Assessment/Plan:  1. Migraine headache  2. Episodic vertigo  3. Cerebral aneurysm, status post clipping  4. Reported memory disturbance  The patient has undergone treatment previously for inner ear disease, and migraine headache without benefit. The patient will be sent for an EEG study, and a brainstem auditory evoked response test. The addition of low-dose diazepam may be done in the future, and a retrial on Topamax may be done. The patient will followup in 4 months.  Addendum: The BAER was abnormal, with absence of the waveform I on the right. MRI with IAC views will  be ordered. The EEG study was also abnormal with right temporal sharpe transients, suggesting a lowered seizure threshold. A trial on Keppra or Topamax in the future may be considered.  Jill Alexanders MD 12/07/2013 7:55 PM  [facility] [address] [place], [place] [address]  Phone [phone] Fax [phone]

## 2013-12-07 NOTE — Telephone Encounter (Signed)
Patient at check out today states that she was told an MRI was ordered for her but there is no order in her chart. Please call patient to clarify.

## 2013-12-08 ENCOUNTER — Other Ambulatory Visit: Payer: PRIVATE HEALTH INSURANCE | Admitting: Radiology

## 2013-12-08 NOTE — Telephone Encounter (Signed)
Order is in he done it when he dictated.

## 2013-12-09 ENCOUNTER — Other Ambulatory Visit: Payer: PRIVATE HEALTH INSURANCE | Admitting: Radiology

## 2013-12-16 ENCOUNTER — Ambulatory Visit
Admission: RE | Admit: 2013-12-16 | Discharge: 2013-12-16 | Disposition: A | Discharge status: Home / Self Care | Payer: Medicare Other | Source: Ambulatory Visit | Attending: Neurology | Admitting: Neurology

## 2013-12-16 DIAGNOSIS — R42 Dizziness and giddiness: Secondary | ICD-10-CM

## 2013-12-16 MED ORDER — GADOBENATE DIMEGLUMINE 529 MG/ML IV SOLN
18.0000 mL | Freq: Once | INTRAVENOUS | Status: AC | PRN
  Administered 2013-12-16: 18 mL via INTRAVENOUS

## 2013-12-17 ENCOUNTER — Telehealth: Payer: Self-pay | Admitting: Neurology

## 2013-12-17 MED ORDER — TOPIRAMATE 25 MG PO TABS: ORAL_TABLET | ORAL | Status: DC

## 2013-12-17 NOTE — Telephone Encounter (Signed)
I called patient. The MRI of the brain was unremarkable. I will try the patient on low-dose Topamax. The patient will followup in the office.

## 2014-01-24 ENCOUNTER — Encounter: Payer: Self-pay | Admitting: Nurse Practitioner

## 2014-01-27 ENCOUNTER — Telehealth (HOSPITAL_COMMUNITY): Payer: Self-pay | Admitting: Interventional Radiology

## 2014-01-27 NOTE — Telephone Encounter (Signed)
Called pt, spoke with her about her upcoming follow-up due in July. Pt states understanding and is in agreement with this plan of care. JMichaux

## 2014-03-15 ENCOUNTER — Other Ambulatory Visit: Payer: Self-pay | Admitting: Radiology

## 2014-03-15 ENCOUNTER — Other Ambulatory Visit (HOSPITAL_COMMUNITY): Payer: Self-pay | Admitting: Interventional Radiology

## 2014-03-15 DIAGNOSIS — I729 Aneurysm of unspecified site: Secondary | ICD-10-CM

## 2014-03-16 ENCOUNTER — Other Ambulatory Visit: Payer: Self-pay | Admitting: Radiology

## 2014-03-18 ENCOUNTER — Ambulatory Visit (HOSPITAL_COMMUNITY)
Admission: RE | Admit: 2014-03-18 | Discharge: 2014-03-18 | Disposition: A | Discharge status: Home / Self Care | Payer: Medicare Other | Source: Ambulatory Visit | Attending: Interventional Radiology | Admitting: Interventional Radiology

## 2014-03-18 ENCOUNTER — Encounter (HOSPITAL_COMMUNITY): Payer: Self-pay

## 2014-03-18 ENCOUNTER — Other Ambulatory Visit (HOSPITAL_COMMUNITY): Payer: Self-pay | Admitting: Interventional Radiology

## 2014-03-18 DIAGNOSIS — I729 Aneurysm of unspecified site: Secondary | ICD-10-CM

## 2014-03-18 DIAGNOSIS — Z87891 Personal history of nicotine dependence: Secondary | ICD-10-CM | POA: Diagnosis not present

## 2014-03-18 DIAGNOSIS — I10 Essential (primary) hypertension: Secondary | ICD-10-CM | POA: Diagnosis not present

## 2014-03-18 DIAGNOSIS — G43909 Migraine, unspecified, not intractable, without status migrainosus: Secondary | ICD-10-CM | POA: Insufficient documentation

## 2014-03-18 DIAGNOSIS — Z48812 Encounter for surgical aftercare following surgery on the circulatory system: Secondary | ICD-10-CM | POA: Diagnosis present

## 2014-03-18 DIAGNOSIS — Z791 Long term (current) use of non-steroidal anti-inflammatories (NSAID): Secondary | ICD-10-CM | POA: Diagnosis not present

## 2014-03-18 DIAGNOSIS — E785 Hyperlipidemia, unspecified: Secondary | ICD-10-CM | POA: Insufficient documentation

## 2014-03-18 DIAGNOSIS — Z7982 Long term (current) use of aspirin: Secondary | ICD-10-CM | POA: Insufficient documentation

## 2014-03-18 DIAGNOSIS — K219 Gastro-esophageal reflux disease without esophagitis: Secondary | ICD-10-CM | POA: Diagnosis not present

## 2014-03-18 LAB — BASIC METABOLIC PANEL
Anion gap: 18 — ABNORMAL HIGH (ref 5–15)
BUN: 15 mg/dL (ref 6–23)
CO2: 21 mEq/L (ref 19–32)
Calcium: 9.6 mg/dL (ref 8.4–10.5)
Chloride: 106 mEq/L (ref 96–112)
Creatinine, Ser: 1.29 mg/dL — ABNORMAL HIGH (ref 0.50–1.10)
GFR calc Af Amer: 51 mL/min — ABNORMAL LOW (ref >90)
GFR, EST NON AFRICAN AMERICAN: 44 mL/min — AB (ref >90)
Glucose, Bld: 105 mg/dL — ABNORMAL HIGH (ref 70–99)
POTASSIUM: 4.4 meq/L (ref 3.7–5.3)
Sodium: 145 mEq/L (ref 137–147)

## 2014-03-18 LAB — CBC WITH DIFFERENTIAL/PLATELET
BASOS ABS: 0.1 10*3/uL (ref 0.0–0.1)
BASOS PCT: 1 % (ref 0–1)
EOS ABS: 0.6 10*3/uL (ref 0.0–0.7)
EOS PCT: 7 % — AB (ref 0–5)
HCT: 40.7 % (ref 36.0–46.0)
Hemoglobin: 13.6 g/dL (ref 12.0–15.0)
Lymphocytes Relative: 45 % (ref 12–46)
Lymphs Abs: 3.5 10*3/uL (ref 0.7–4.0)
MCH: 32.4 pg (ref 26.0–34.0)
MCHC: 33.4 g/dL (ref 30.0–36.0)
MCV: 96.9 fL (ref 78.0–100.0)
Monocytes Absolute: 0.7 10*3/uL (ref 0.1–1.0)
Monocytes Relative: 9 % (ref 3–12)
Neutro Abs: 2.9 10*3/uL (ref 1.7–7.7)
Neutrophils Relative %: 38 % — ABNORMAL LOW (ref 43–77)
PLATELETS: 269 10*3/uL (ref 150–400)
RBC: 4.2 MIL/uL (ref 3.87–5.11)
RDW: 13.2 % (ref 11.5–15.5)
WBC: 7.6 10*3/uL (ref 4.0–10.5)

## 2014-03-18 LAB — APTT: aPTT: 24 seconds (ref 24–37)

## 2014-03-18 LAB — PROTIME-INR
INR: 1.03 (ref 0.00–1.49)
PROTHROMBIN TIME: 13.5 s (ref 11.6–15.2)

## 2014-03-18 MED ORDER — MIDAZOLAM HCL 2 MG/2ML IJ SOLN
INTRAMUSCULAR | Status: AC
  Filled 2014-03-18: qty 4

## 2014-03-18 MED ORDER — ASPIRIN 81 MG PO CHEW
CHEWABLE_TABLET | ORAL | Status: AC
  Administered 2014-03-18: 324 mg
  Filled 2014-03-18: qty 4

## 2014-03-18 MED ORDER — FENTANYL CITRATE 0.05 MG/ML IJ SOLN
INTRAMUSCULAR | Status: AC | PRN
  Administered 2014-03-18: 25 ug via INTRAVENOUS

## 2014-03-18 MED ORDER — DIPHENHYDRAMINE HCL 50 MG/ML IJ SOLN
INTRAMUSCULAR | Status: AC
  Filled 2014-03-18: qty 1

## 2014-03-18 MED ORDER — ASPIRIN 81 MG PO CHEW: 324.0000 mg | CHEWABLE_TABLET | Freq: Once | ORAL | Status: AC

## 2014-03-18 MED ORDER — IOHEXOL 300 MG/ML  SOLN
150.0000 mL | Freq: Once | INTRAMUSCULAR | Status: AC | PRN
  Administered 2014-03-18: 110 mL via INTRA_ARTERIAL

## 2014-03-18 MED ORDER — FLUMAZENIL 0.5 MG/5ML IV SOLN
INTRAVENOUS | Status: AC
  Filled 2014-03-18: qty 5

## 2014-03-18 MED ORDER — SODIUM CHLORIDE 0.9 % IV SOLN
INTRAVENOUS | Status: DC
  Administered 2014-03-18: 09:00:00 via INTRAVENOUS

## 2014-03-18 MED ORDER — DIPHENHYDRAMINE HCL 50 MG/ML IJ SOLN
INTRAMUSCULAR | Status: AC | PRN
  Administered 2014-03-18: 50 mg via INTRAVENOUS

## 2014-03-18 MED ORDER — HEPARIN SODIUM (PORCINE) 1000 UNIT/ML IJ SOLN
1000.0000 [IU] | Freq: Once | INTRAMUSCULAR | Status: AC
  Administered 2014-03-18: 1000 [IU] via INTRAVENOUS

## 2014-03-18 MED ORDER — SODIUM CHLORIDE 0.9 % IV SOLN: INTRAVENOUS | Status: AC

## 2014-03-18 MED ORDER — NALOXONE HCL 0.4 MG/ML IJ SOLN
INTRAMUSCULAR | Status: AC
  Filled 2014-03-18: qty 1

## 2014-03-18 MED ORDER — HEPARIN SODIUM (PORCINE) 1000 UNIT/ML IJ SOLN
INTRAMUSCULAR | Status: AC
  Filled 2014-03-18: qty 1

## 2014-03-18 MED ORDER — FENTANYL CITRATE 0.05 MG/ML IJ SOLN
INTRAMUSCULAR | Status: AC
  Filled 2014-03-18: qty 2

## 2014-03-18 NOTE — Progress Notes (Signed)
Pulses 3plus

## 2014-03-18 NOTE — Discharge Instructions (Signed)

## 2014-03-18 NOTE — Progress Notes (Signed)
Neuro check perla, bi hand grips, moves all extremities, aao x3

## 2014-03-18 NOTE — H&P (Signed)
Chief Complaint: "I am here for my brain aneurysm study." HPI: Amy Glover is an 63 y.o. female with history of ACA aneurysm clipping in [place] 2011. The patient last had a cerebral arteriogram in 2013 and MRI 11/2013. She c/o increased symptoms of dizziness, imbalance, and falls which were her previous symptoms before clipping. She is unsure of LOC because nobody is around to witness her falls, but she often finds herself on the floor and does not know how she got there. She is scheduled today for a cerebral arteriogram with moderate sedation. She denies any chest pain, shortness of breath or palpitations. She denies any active signs of bleeding or excessive bruising. She denies any recent fever or chills. The patient denies any history of sleep apnea or chronic oxygen use. She has previously tolerated sedation without complications.    Past Medical History:  Past Medical History  Diagnosis Date  . Brain aneurysm     right ACOM  . Dizziness and giddiness 12/07/2013  . Migraine without aura, without mention of intractable migraine without mention of status migrainosus 12/07/2013  . GERD (gastroesophageal reflux disease)   . Subclavian steal syndrome   . Hypertension   . Dyslipidemia     Past Surgical History:  Past Surgical History  Procedure Laterality Date  . Brain surgery  04/20011    right ACOM aneurysm clip  . 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 Wrist surgery Right 2009  . Stent in neck  2009  . Tonsillectomy and adenoidectomy  1961  . Shoulder surgery  2008  . Cesarean section  1984  . Tubal ligation  1987    Family History:  Family History  Problem Relation Age of Onset  . Heart disease Mother   . Hypertension Father   . Cancer Father   . Multiple sclerosis Sister     Social History:  reports that she has quit smoking. She has never used smokeless tobacco. She reports that she drinks alcohol. She reports that she does not use illicit drugs.  Allergies:  Allergies   Allergen Reactions  . Sulfa Antibiotics Hives    Medications:   Medication List    ASK your doctor about these medications       ALPRAZolam 0.25 MG tablet  Commonly known as:  XANAX  Take 0.25 mg by mouth 3 (three) times daily as needed. For anxiety     aspirin EC 81 MG tablet  Take 162 mg by mouth every evening.     atorvastatin 40 MG tablet  Commonly known as:  LIPITOR  Take 40 mg by mouth daily.     BREO ELLIPTA 100-25 MCG/INH Aepb  Generic drug:  Fluticasone Furoate-Vilanterol  Inhale 1 puff into the lungs daily.     CALCIUM 1200 PO  Take 1 tablet by mouth daily.     carvedilol 6.25 MG tablet  Commonly known as:  COREG  Take 3.125 mg by mouth 2 (two) times daily with a meal.     cholecalciferol 1000 UNITS tablet  Commonly known as:  VITAMIN D  Take 5,000 Units by mouth daily.     fexofenadine 180 MG tablet  Commonly known as:  ALLEGRA  Take 90 mg by mouth at bedtime.     Fish Oil 1000 MG Caps  Take 1,000 mg by mouth 2 (two) times daily.     furosemide 40 MG tablet  Commonly known as:  LASIX  Take 40 mg by mouth daily.  lansoprazole 15 MG capsule  Commonly known as:  PREVACID  Take 15 mg by mouth 2 (two) times daily.     lisinopril 5 MG tablet  Commonly known as:  PRINIVIL,ZESTRIL  Take 5 mg by mouth every evening.     Magnesium-Zinc 133.33-5 MG Tabs  Take 1 tablet by mouth 2 (two) times daily.     meclizine 25 MG tablet  Commonly known as:  ANTIVERT  Take 25 mg by mouth 3 (three) times daily as needed for dizziness.     multivitamin with minerals Tabs tablet  Take 1 tablet by mouth daily.     naproxen 500 MG tablet  Commonly known as:  NAPROSYN  Take 500 mg by mouth 2 (two) times daily with a meal.     potassium chloride SA 20 MEQ tablet  Commonly known as:  K-DUR,KLOR-CON  Take 40 mEq by mouth daily.     promethazine 25 MG tablet  Commonly known as:  PHENERGAN  Take 25 mg by mouth every 8 (eight) hours as needed. For nausea      topiramate 25 MG tablet  Commonly known as:  TOPAMAX  Take 25 mg by mouth 2 (two) times daily.     TUDORZA PRESSAIR 400 MCG/ACT Aepb  Generic drug:  Aclidinium Bromide  Inhale 1 puff into the lungs 2 (two) times daily.     VIIBRYD 40 MG Tabs  Generic drug:  Vilazodone HCl  Take 40 mg by mouth daily.     zolpidem 10 MG tablet  Commonly known as:  AMBIEN  Take 5 mg by mouth at bedtime as needed. For sleep       Please HPI for pertinent positives, otherwise complete 10 system ROS negative.  Physical Exam: BP 102/49  Pulse 78  Temp(Src) 97.7 F (36.5 C) (Oral)  Resp 18  Ht _0  (1.626 m)  Wt 190 lb (86.183 kg)  BMI 32.60 kg/m2  SpO2 98% Body mass index is 32.6 kg/(m^2).  General Appearance:  Alert, cooperative, no distress  Head:  Normocephalic, without obvious abnormality, atraumatic  Neck: Supple, symmetrical, trachea midline  Lungs:   Clear to auscultation bilaterally, no w/r/r, respirations unlabored without use of accessory muscles.  Chest Wall:  No tenderness or deformity  Heart:  Regular rate and rhythm, S1, S2 normal, no murmur, rub or gallop.  Abdomen:   Soft, non-tender, non distended, (+) BS  Extremities: Extremities normal, atraumatic, no cyanosis or edema  Pulses: 1+ and symmetric  Neurologic: Normal affect, no gross deficits.   Results for orders placed during the hospital encounter of 03/18/14 (from the past 48 hour(s))  APTT     Status: None   Collection Time    03/18/14  8:33 AM      Result Value Ref Range   aPTT 24  24 - 37 seconds  BASIC METABOLIC PANEL     Status: Abnormal   Collection Time    03/18/14  8:33 AM      Result Value Ref Range   Sodium 145  137 - 147 mEq/L   Potassium 4.4  3.7 - 5.3 mEq/L   Chloride 106  96 - 112 mEq/L   CO2 21  19 - 32 mEq/L   Glucose, Bld 105 (*) 70 - 99 mg/dL   BUN 15  6 - 23 mg/dL   Creatinine, Ser 1.29 (*) 0.50 - 1.10 mg/dL   Calcium 9.6  8.4 - 10.5 mg/dL   GFR calc non Af Amer 44 (*) >  90 mL/min   GFR calc  Af Amer 51 (*) >90 mL/min   Comment: (NOTE)     The eGFR has been calculated using the CKD EPI equation.     This calculation has not been validated in all clinical situations.     eGFR's persistently <90 mL/min signify possible Chronic Kidney     Disease.   Anion gap 18 (*) 5 - 15  CBC WITH DIFFERENTIAL     Status: Abnormal   Collection Time    03/18/14  8:33 AM      Result Value Ref Range   WBC 7.6  4.0 - 10.5 K/uL   RBC 4.20  3.87 - 5.11 MIL/uL   Hemoglobin 13.6  12.0 - 15.0 g/dL   HCT 40.7  36.0 - 46.0 %   MCV 96.9  78.0 - 100.0 fL   MCH 32.4  26.0 - 34.0 pg   MCHC 33.4  30.0 - 36.0 g/dL   RDW 13.2  11.5 - 15.5 %   Platelets 269  150 - 400 K/uL   Neutrophils Relative % 38 (*) 43 - 77 %   Neutro Abs 2.9  1.7 - 7.7 K/uL   Lymphocytes Relative 45  12 - 46 %   Lymphs Abs 3.5  0.7 - 4.0 K/uL   Monocytes Relative 9  3 - 12 %   Monocytes Absolute 0.7  0.1 - 1.0 K/uL   Eosinophils Relative 7 (*) 0 - 5 %   Eosinophils Absolute 0.6  0.0 - 0.7 K/uL   Basophils Relative 1  0 - 1 %   Basophils Absolute 0.1  0.0 - 0.1 K/uL  PROTIME-INR     Status: None   Collection Time    03/18/14  8:33 AM      Result Value Ref Range   Prothrombin Time 13.5  11.6 - 15.2 seconds   INR 1.03  0.00 - 1.49   No results found.  Assessment/Plan History of ACA aneurysm clipping in [place] 2011 Increased symptoms of dizziness, imbalance, and falls. Scheduled today for a cerebral arteriogram with moderate sedation. MRI 11/2013 with no acute changes.  Patient has been NPO, on aspirin, labs reviewed, last arteriogram 12/2011 Risks and Benefits discussed with the patient. All of the patient's questions were answered, patient is agreeable to proceed. Consent signed and in chart.   Tsosie Billing D PA-C 03/18/2014, 9:40 AM

## 2014-03-18 NOTE — Procedures (Signed)
S/P 4 vessel cerebral arteriogram. RT CFA Findings. 1.No evidence of  Aneurysm in the region of surgical clips in  [place].. 2. No new aneurysm. 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

## 2014-03-18 NOTE — Progress Notes (Signed)
Perla, movies all extremities x4 aao x 3

## 2014-03-30 NOTE — Telephone Encounter (Signed)
Noted  

## 2014-04-10 ENCOUNTER — Other Ambulatory Visit: Payer: Self-pay | Admitting: Neurology

## 2014-04-11 ENCOUNTER — Other Ambulatory Visit: Payer: Self-pay | Admitting: Neurology

## 2014-04-15 ENCOUNTER — Ambulatory Visit: Payer: PRIVATE HEALTH INSURANCE | Admitting: Nurse Practitioner

## 2014-04-18 ENCOUNTER — Encounter: Payer: Self-pay | Admitting: Adult Health

## 2014-04-18 ENCOUNTER — Ambulatory Visit (INDEPENDENT_AMBULATORY_CARE_PROVIDER_SITE_OTHER): Payer: Medicare Other | Admitting: Adult Health

## 2014-04-18 VITALS — Ht 63.0 in | Wt 190.0 lb

## 2014-04-18 DIAGNOSIS — R296 Repeated falls: Secondary | ICD-10-CM | POA: Insufficient documentation

## 2014-04-18 DIAGNOSIS — G43009 Migraine without aura, not intractable, without status migrainosus: Secondary | ICD-10-CM

## 2014-04-18 DIAGNOSIS — R42 Dizziness and giddiness: Secondary | ICD-10-CM

## 2014-04-18 DIAGNOSIS — Z9181 History of falling: Secondary | ICD-10-CM

## 2014-04-18 MED ORDER — TOPIRAMATE 25 MG PO TABS: 50.0000 mg | ORAL_TABLET | Freq: Two times a day (BID) | ORAL | Status: DC

## 2014-04-18 NOTE — Progress Notes (Signed)
PATIENT: SAHARA FUJIMOTO DOB: December 07, 1950  REASON FOR VISIT: follow up HISTORY FROM: patient  HISTORY OF PRESENT ILLNESS: Ms. Mchaney is a 63 year old female with a history of migraines and dizziness. She returns today for follow-up. In the past she has had an abnormal EEG showing sharp transients emanating for the right temporal area. She has also had an abnormal BAER test showing absence of waveforms 1 on the right. MRI was unremarkable. She was starting on low-dose Topamax. She reports that her memory was affected after her aneurysm surgery. She states that she does have a daily headache. Most of the time her headaches are pressure sensations behind her eyes like sinus pressure. She normally takes psuedoephedrine and Advil and her headache will go away. She continues to have vertigo and has fallen more since the last visit. She has been told her falling has been due to vertigo. However her episodes of dizziness and falling do not always occur at the same time.   HISTORY 04/18/14 (CW):    history of vertigo that began in 2009. The patient had nausea associated with the vertigo and dizziness that would occur on average once a week. The episodes last one to 2 hours at a time. 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 The patient indicates that she underwent coiling and clipping of the aneurysm. The patient has had some memory and concentration issues since the surgery, but this was not present prior to the surgery. The patient has been seen by Dr. Thornell Mule for the vertigo, and she was told she may have migraine equivalent. The patient is sent to Dr. Tommi Rumps for migraine therapy, and use of Topamax, injections, and baclofen did not help. The patient has had some gradual worsening of episodes of dizziness to the point that they are now occurring daily in nature, again lasting an hour to 2 hours. The patient has undergone vestibular  rehabilitation without benefit. The patient recently seen by Dr. Polly Cobia and a repeat neurologic evaluation was suggested. The patient indicates that she is on meclizine which does benefit her with the dizziness, and alprazolam seems to help the dizziness slightly as well. The patient denies any headache associated with the episode of dizziness, but she still has some occasional migraine. Migraine headaches have gradually improved, particularly after she went through menopause. The patient has tinnitus in the right ear, she denies any alteration in hearing or in the tinnitus associated with the episodes of vertigo. The patient has no significant hearing loss. 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 Recent cerebral angiography has shown no problems with this issue. The patient denies any blackout episodes, but she does have some memory problems and slight confusion since the aneurysm surgery. The patient will fall on occasion. The patient is sent to this office for further evaluation.   REVIEW OF SYSTEMS: Full 14 system review of systems performed and notable only for:  Constitutional: N/A  Eyes: N/A Ear/Nose/Throat: Ringing in  ears  Skin: N/A  Cardiovascular: N/A  Respiratory: N/A  Gastrointestinal: N/A  Genitourinary: N/A Hematology/Lymphatic: N/A  Endocrine: N/A Musculoskeletal:N/A  Allergy/Immunology: N/A  Neurological: Memory loss, dizziness, headache Psychiatric: N/A Sleep: Insomnia   ALLERGIES: Allergies  Allergen Reactions  . Sulfa Antibiotics Hives and Nausea And Vomiting    HOME  MEDICATIONS: Outpatient Prescriptions Prior to Visit  Medication Sig Dispense Refill  . Aclidinium Bromide (TUDORZA PRESSAIR) 400 MCG/ACT AEPB Inhale 1 puff into the lungs 2 (two) times daily.      Marland Kitchen ALPRAZolam (XANAX) 0.25 MG tablet Take 0.25 mg by mouth 3 (three) times  daily as needed. For anxiety      . aspirin EC 81 MG tablet Take 162 mg by mouth every evening.      Marland Kitchen atorvastatin (LIPITOR) 40 MG tablet Take 40 mg by mouth daily.       . Calcium Carbonate-Vit D-Min (CALCIUM 1200 PO) Take 1 tablet by mouth daily.      . carvedilol (COREG) 6.25 MG tablet Take 3.125 mg by mouth 2 (two) times daily with a meal.       . cholecalciferol (VITAMIN D) 1000 UNITS tablet Take 5,000 Units by mouth daily.      . fexofenadine (ALLEGRA) 180 MG tablet Take 90 mg by mouth at bedtime.      . Fluticasone Furoate-Vilanterol (BREO ELLIPTA) 100-25 MCG/INH AEPB Inhale 1 puff into the lungs daily.       . furosemide (LASIX) 40 MG tablet Take 40 mg by mouth daily.      . lansoprazole (PREVACID) 15 MG capsule Take 15 mg by mouth 2 (two) times daily.      Marland Kitchen lisinopril (PRINIVIL,ZESTRIL) 5 MG tablet Take 5 mg by mouth every evening.       . Magnesium-Zinc 133.33-5 MG TABS Take 1 tablet by mouth 2 (two) times daily.       . meclizine (ANTIVERT) 25 MG tablet Take 25 mg by mouth 3 (three) times daily as needed for dizziness.       . Multiple Vitamin (MULITIVITAMIN WITH MINERALS) TABS Take 1 tablet by mouth daily.      . naproxen (NAPROSYN) 500 MG tablet Take 500 mg by mouth 2 (two) times daily with a meal.      . Omega-3 Fatty Acids (FISH OIL) 1000 MG CAPS Take 1,000 mg by mouth 2 (two) times daily.      . potassium chloride SA (K-DUR,KLOR-CON) 20 MEQ tablet Take 40 mEq by mouth daily.       . promethazine (PHENERGAN) 25 MG tablet Take 25 mg by mouth every 8 (eight) hours as needed. For nausea      . topiramate (TOPAMAX) 25 MG tablet Take 25 mg by mouth 2 (two) times daily.      . Vilazodone HCl (VIIBRYD) 40 MG TABS Take 40 mg by mouth daily.      Marland Kitchen zolpidem (AMBIEN) 10 MG tablet Take 5 mg by mouth at bedtime as needed. For sleep      . topiramate (TOPAMAX) 25 MG tablet TAKE 1 TABLET BY MOUTH EVERY DAY FOR 1 WEEK. THEN TAKE 1 TABLET TWICE DAILY  60 tablet  0   No facility-administered  medications prior to visit.    PAST MEDICAL HISTORY: Past Medical History  Diagnosis Date  . Brain aneurysm     right ACOM  . Dizziness and giddiness 12/07/2013  . Migraine without aura, without mention of intractable migraine without mention of status migrainosus 12/07/2013  . GERD (gastroesophageal reflux disease)   . Subclavian steal syndrome   . Hypertension   . Dyslipidemia     PAST SURGICAL HISTORY: Past Surgical History  Procedure Laterality Date  . Brain surgery  04/20011    right ACOM aneurysm clip  . 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 Wrist surgery Right 2009  . Stent in neck  2009  . Tonsillectomy and adenoidectomy  1961  . Shoulder surgery  2008  . Cesarean section  1984  . Tubal ligation  1987    FAMILY HISTORY: Family History  Problem Relation Age of Onset  . Heart disease Mother   . Hypertension Father   . Cancer Father   . Multiple sclerosis Sister     SOCIAL HISTORY: History   Social History  . Marital Status: Married    Spouse Name: N/A    Number of Children: 1  . Years of Education: MA   Occupational History  . Disability    Social History Main Topics  . Smoking status: Former Research scientist (life sciences)  . Smokeless tobacco: Never Used     Comment: QUIT 5 YEARS AGO  . Alcohol Use: Yes     Comment: occasional  . Drug Use: No  . Sexual Activity: Not on file   Other Topics Concern  . Not on file   Social History Narrative  . No narrative on file      PHYSICAL EXAM  Filed Vitals:   04/18/14 1131  Height: 5\' 3"  (1.6 m)  Weight: 190 lb (86.183 kg)   Body mass index is 33.67 kg/(m^2).  Generalized: Well developed, in no acute distress   Neurological examination  Mentation: Alert oriented to time, place, history taking. Follows all commands speech and language fluent Cranial nerve II-XII: Pupils were equal round reactive to light. Extraocular movements were full, visual field were full on confrontational test. Facial sensation and strength were  normal. Head turning and shoulder shrug  were normal and symmetric. Motor: The motor testing reveals 5 over 5 strength of all 4 extremities. Good symmetric motor tone is noted throughout.  Sensory: Sensory testing is intact to soft touch on all 4 extremities. No evidence of extinction is noted.  Coordination: Cerebellar testing reveals good finger-nose-finger and heel-to-shin bilaterally.  Gait and station: Gait is normal. Tandem gait is unsteady. Romberg is negative. No drift is seen.  Reflexes: Deep tendon reflexes are symmetric but depressed.    DIAGNOSTIC DATA (LABS, IMAGING, TESTING) - I reviewed patient records, labs, notes, testing and imaging myself where available.  Lab Results  Component Value Date   WBC 7.6 03/18/2014   HGB 13.6 03/18/2014   HCT 40.7 03/18/2014   MCV 96.9 03/18/2014   PLT 269 03/18/2014      Component Value Date/Time   NA 145 03/18/2014 0833   K 4.4 03/18/2014 0833   CL 106 03/18/2014 0833   CO2 21 03/18/2014 0833   GLUCOSE 105* 03/18/2014 0833   BUN 15 03/18/2014 0833   CREATININE 1.29* 03/18/2014 0833   CALCIUM 9.6 03/18/2014 0833   GFRNONAA 44* 03/18/2014 0833   GFRAA 51* 03/18/2014 0833       ASSESSMENT AND PLAN 63 y.o. year old female  has a past medical history of Brain aneurysm; Dizziness and giddiness (12/07/2013); Migraine without aura, without mention of intractable migraine without mention of status migrainosus (12/07/2013); GERD (gastroesophageal reflux disease); Subclavian steal syndrome; Hypertension; and Dyslipidemia. here with:  1. Migraines 2. Dizziness 3. Frequent Falls  Patient continues to have headaches that she equates to her sinuses however the MRI did not show any sinus disease. These could be a migraine variant. I will increase Topamax to 50 mg BID. The patient continues to have episodic dizziness. She uses meclizine and sometimes xanax which does help. She has tried vestibular  rehab in the past with some mild improvement. She continues  to have falls that she has been told is related to her vertigo. For now we will increase the Topamax and in three months she will follow-up with Dr. Jannifer Franklin. If her symptoms worsen or she develops new symptoms she should let us know.   Ward Givens, MSN, NP-C 04/18/2014, 11:49 AM [facility] [address], [place], [place] [address] [phone]  Note: This document was prepared with digital dictation and possible smart phrase technology. Any transcriptional errors that result from this process are unintentional.

## 2014-04-18 NOTE — Progress Notes (Signed)
I have read the note, and I agree with the clinical assessment and plan.  WILLIS,CHARLES KEITH   

## 2014-04-18 NOTE — Patient Instructions (Signed)
Vertigo °Vertigo means you feel like you or your surroundings are moving when they are not. Vertigo can be dangerous if it occurs when you are at work, driving, or performing difficult activities.  °CAUSES  °Vertigo occurs when there is a conflict of signals sent to your brain from the visual and sensory systems in your body. There are many different causes of vertigo, including: °· Infections, especially in the inner ear. °· A bad reaction to a drug or misuse of alcohol and medicines. °· Withdrawal from drugs or alcohol. °· Rapidly changing positions, such as lying down or rolling over in bed. °· A migraine headache. °· Decreased blood flow to the brain. °· Increased pressure in the brain from a head injury, infection, tumor, or bleeding. °SYMPTOMS  °You may feel as though the world is spinning around or you are falling to the ground. Because your balance is upset, vertigo can cause nausea and vomiting. You may have involuntary eye movements (nystagmus). °DIAGNOSIS  °Vertigo is usually diagnosed by physical exam. If the cause of your vertigo is unknown, your caregiver may perform imaging tests, such as an MRI scan (magnetic resonance imaging). °TREATMENT  °Most cases of vertigo resolve on their own, without treatment. Depending on the cause, your caregiver may prescribe certain medicines. If your vertigo is related to body position issues, your caregiver may recommend movements or procedures to correct the problem. In rare cases, if your vertigo is caused by certain inner ear problems, you may need surgery. °HOME CARE INSTRUCTIONS  °· Follow your caregiver's instructions. °· Avoid driving. °· Avoid operating heavy machinery. °· Avoid performing any tasks that would be dangerous to you or others during a vertigo episode. °· Tell your caregiver if you notice that certain medicines seem to be causing your vertigo. Some of the medicines used to treat vertigo episodes can actually make them worse in some people. °SEEK  IMMEDIATE MEDICAL CARE IF:  °· Your medicines do not relieve your vertigo or are making it worse. °· You develop problems with talking, walking, weakness, or using your arms, hands, or legs. °· You develop severe headaches. °· Your nausea or vomiting continues or gets worse. °· You develop visual changes. °· A family member notices behavioral changes. °· Your condition gets worse. °MAKE SURE YOU: °· Understand these instructions. °· Will watch your condition. °· Will get help right away if you are not doing well or get worse. °Document Released: 05/22/2005 Document Revised: 11/04/2011 Document Reviewed: 02/28/2011 °ExitCare® Patient Information ©2015 [employer]. This information is not intended to replace advice given to you by your health care provider. Make sure you discuss any questions you have with your health care provider. ° °Migraine Headache °A migraine headache is an intense, throbbing pain on one or both sides of your head. A migraine can last for 30 minutes to several hours. °CAUSES  °The exact cause of a migraine headache is not always known. However, a migraine may be caused when nerves in the brain become irritated and release chemicals that cause inflammation. This causes pain. °Certain things may also trigger migraines, such as: °· Alcohol. °· Smoking. °· Stress. °· Menstruation. °· Aged cheeses. °· Foods or drinks that contain nitrates, glutamate, aspartame, or tyramine. °· Lack of sleep. °· Chocolate. °· Caffeine. °· Hunger. °· Physical exertion. °· Fatigue. °· Medicines used to treat chest pain (nitroglycerine), birth control pills, estrogen, and some blood pressure medicines. °SIGNS AND SYMPTOMS °· Pain on one or both sides of your head. °·   Pulsating or throbbing pain. °· Severe pain that prevents daily activities. °· Pain that is aggravated by any physical activity. °· Nausea, vomiting, or both. °· Dizziness. °· Pain with exposure to bright lights, loud noises, or activity. °· General  sensitivity to bright lights, loud noises, or smells. °Before you get a migraine, you may get warning signs that a migraine is coming (aura). An aura may include: °· Seeing flashing lights. °· Seeing bright spots, halos, or zigzag lines. °· Having tunnel vision or blurred vision. °· Having feelings of numbness or tingling. °· Having trouble talking. °· Having muscle weakness. °DIAGNOSIS  °A migraine headache is often diagnosed based on: °· Symptoms. °· Physical exam. °· A CT scan or MRI of your head. These imaging tests cannot diagnose migraines, but they can help rule out other causes of headaches. °TREATMENT °Medicines may be given for pain and nausea. Medicines can also be given to help prevent recurrent migraines.  °HOME CARE INSTRUCTIONS °· Only take over-the-counter or prescription medicines for pain or discomfort as directed by your health care provider. The use of long-term narcotics is not recommended. °· Lie down in a dark, quiet room when you have a migraine. °· Keep a journal to find out what may trigger your migraine headaches. For example, write down: °¨ What you eat and drink. °¨ How much sleep you get. °¨ Any change to your diet or medicines. °· Limit alcohol consumption. °· Quit smoking if you smoke. °· Get 7-9 hours of sleep, or as recommended by your health care provider. °· Limit stress. °· Keep lights dim if bright lights bother you and make your migraines worse. °SEEK IMMEDIATE MEDICAL CARE IF:  °· Your migraine becomes severe. °· You have a fever. °· You have a stiff neck. °· You have vision loss. °· You have muscular weakness or loss of muscle control. °· You start losing your balance or have trouble walking. °· You feel faint or pass out. °· You have severe symptoms that are different from your first symptoms. °MAKE SURE YOU:  °· Understand these instructions. °· Will watch your condition. °· Will get help right away if you are not doing well or get worse. °Document Released: 08/12/2005  Document Revised: 12/27/2013 Document Reviewed: 04/19/2013 °ExitCare® Patient Information ©2015 [employer]. This information is not intended to replace advice given to you by your health care provider. Make sure you discuss any questions you have with your health care provider. ° °

## 2014-04-28 ENCOUNTER — Other Ambulatory Visit: Payer: Self-pay

## 2014-04-28 DIAGNOSIS — Z1231 Encounter for screening mammogram for malignant neoplasm of breast: Secondary | ICD-10-CM

## 2014-05-16 ENCOUNTER — Ambulatory Visit: Payer: Self-pay

## 2014-08-03 ENCOUNTER — Telehealth: Payer: Self-pay | Admitting: Neurology

## 2014-08-03 MED ORDER — TOPIRAMATE 25 MG PO TABS
50.0000 mg | ORAL_TABLET | Freq: Two times a day (BID) | ORAL | Status: DC
Start: 2014-08-03 — End: 2014-08-29

## 2014-08-03 NOTE — Telephone Encounter (Signed)
Pt needs a refill on topiramate (TOPAMAX) 25 MG tablet. She only has enough to last through this week.  She uses [facility] in [place].  Please call and advise, you may leave a message if no answer.

## 2014-08-03 NOTE — Telephone Encounter (Signed)
Rx has been sent.  I called back.  She is aware.  

## 2014-08-22 ENCOUNTER — Other Ambulatory Visit: Payer: Self-pay | Admitting: Adult Health

## 2014-08-29 ENCOUNTER — Ambulatory Visit (INDEPENDENT_AMBULATORY_CARE_PROVIDER_SITE_OTHER): Payer: Medicare Other | Admitting: Neurology

## 2014-08-29 ENCOUNTER — Encounter: Payer: Self-pay | Admitting: Neurology

## 2014-08-29 VITALS — BP 118/70 | HR 69 | Ht 64.0 in | Wt 172.4 lb

## 2014-08-29 DIAGNOSIS — G43019 Migraine without aura, intractable, without status migrainosus: Secondary | ICD-10-CM

## 2014-08-29 DIAGNOSIS — R296 Repeated falls: Secondary | ICD-10-CM

## 2014-08-29 DIAGNOSIS — R42 Dizziness and giddiness: Secondary | ICD-10-CM

## 2014-08-29 MED ORDER — DIVALPROEX SODIUM 500 MG PO DR TAB: DELAYED_RELEASE_TABLET | ORAL | Status: DC

## 2014-08-29 NOTE — Progress Notes (Signed)
Reason for visit: Headache, gait disturbance, dizziness  Amy Glover is an 64 y.o. female  History of present illness:  Amy Glover is a 64 year old right-handed white female with a history of a chronic gait disorder and a chronic history of headache. The patient has had a cerebral aneurysm clipped previously. The patient has had ENT evaluations for the dizziness, she has undergone vestibular rehabilitation without benefit. The patient did undergo an EEG study that shows right temporal sharp wave activity. She was placed on Topamax for the headache and for the dizziness and because of the EEG abnormalities. The patient has not gained any benefit whatsoever. The patient indicates that the dizziness and falls do not always occur together. The patient may have an occasional fall where she does not remember the fall itself. The patient is on 100 mg daily of Topamax. She does have some neck discomfort. The headaches are daily in nature, usually in the retro-orbital regions, left greater than right. She has a cane for ambulation, but she rarely uses a cane. She returns to this office for an evaluation. MRI of the brain has been done previously.  Past Medical History  Diagnosis Date  . Brain aneurysm     right ACOM  . Dizziness and giddiness 12/07/2013  . Migraine without aura, without mention of intractable migraine without mention of status migrainosus 12/07/2013  . GERD (gastroesophageal reflux disease)   . Subclavian steal syndrome   . Hypertension   . Dyslipidemia     Past Surgical History  Procedure Laterality Date  . Brain surgery  04/20011    right ACOM aneurysm clip  . 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 Wrist surgery Right 2009  . Stent in neck  2009  . Tonsillectomy and adenoidectomy  1961  . Shoulder surgery  2008  . Cesarean section  1984  . Tubal ligation  1987    Family History  Problem Relation Age of Onset  . Heart disease Mother   . Hypertension Father   .  Cancer Father   . Multiple sclerosis Sister   . Seizures Sister     Social history:  reports that she has quit smoking. She has never used smokeless tobacco. She reports that she drinks alcohol. She reports that she does not use illicit drugs.    Allergies  Allergen Reactions  . Sulfa Antibiotics Hives and Nausea And Vomiting    Medications:  Current Outpatient Prescriptions on File Prior to Visit  Medication Sig Dispense Refill  . Aclidinium Bromide (TUDORZA PRESSAIR) 400 MCG/ACT AEPB Inhale 1 puff into the lungs 2 (two) times daily.    Marland Kitchen ALPRAZolam (XANAX) 0.25 MG tablet Take 0.25 mg by mouth 3 (three) times daily as needed. For anxiety    . aspirin EC 81 MG tablet Take 162 mg by mouth every evening.    Marland Kitchen atorvastatin (LIPITOR) 40 MG tablet Take 40 mg by mouth daily.     . Calcium Carbonate-Vit D-Min (CALCIUM 1200 PO) Take 1 tablet by mouth daily.    . carvedilol (COREG) 6.25 MG tablet Take 3.125 mg by mouth 2 (two) times daily with a meal.     . cholecalciferol (VITAMIN D) 1000 UNITS tablet Take 5,000 Units by mouth daily.    . fexofenadine (ALLEGRA) 180 MG tablet Take 90 mg by mouth as needed.     . fluconazole (DIFLUCAN) 200 MG tablet 200 mg as needed.     . Fluticasone Furoate-Vilanterol (BREO  ELLIPTA) 100-25 MCG/INH AEPB Inhale 1 puff into the lungs daily.     . furosemide (LASIX) 40 MG tablet Take 40 mg by mouth daily.    . lansoprazole (PREVACID) 15 MG capsule Take 15 mg by mouth 2 (two) times daily.    Marland Kitchen lisinopril (PRINIVIL,ZESTRIL) 5 MG tablet Take 5 mg by mouth every evening.     . Magnesium-Zinc 133.33-5 MG TABS Take 1 tablet by mouth 2 (two) times daily.     . meclizine (ANTIVERT) 25 MG tablet Take 25 mg by mouth 3 (three) times daily as needed for dizziness. Patient takes at least one daily.    . Multiple Vitamin (MULITIVITAMIN WITH MINERALS) TABS Take 1 tablet by mouth daily.    . naproxen (NAPROSYN) 500 MG tablet Take 500 mg by mouth 2 (two) times daily with a meal.     . Omega-3 Fatty Acids (FISH OIL) 1000 MG CAPS Take 1,000 mg by mouth 2 (two) times daily.    . potassium chloride SA (K-DUR,KLOR-CON) 20 MEQ tablet Take 40 mEq by mouth daily.     . promethazine (PHENERGAN) 25 MG tablet Take 25 mg by mouth every 8 (eight) hours as needed. For nausea    . Vilazodone HCl (VIIBRYD) 40 MG TABS Take 40 mg by mouth daily.    Marland Kitchen zolpidem (AMBIEN) 10 MG tablet Take 5 mg by mouth at bedtime as needed. For sleep     No current facility-administered medications on file prior to visit.    ROS:  Out of a complete 14 system review of symptoms, the patient complains only of the following symptoms, and all other reviewed systems are negative.  Ringing in the ears, left ear pain Headache Dizziness Gait disturbance  Blood pressure 118/70, pulse 69, height 5\' 4"  (1.626 m), weight 172 lb 6.4 oz (78.2 kg).  Physical Exam  General: The patient is alert and cooperative at the time of the examination.  Skin: No significant peripheral edema is noted.   Neurologic Exam  Mental status: The patient is oriented x 3.  Cranial nerves: Facial symmetry is present. Speech is normal, no aphasia or dysarthria is noted. Extraocular movements are full. Visual fields are full.  Motor: The patient has good strength in all 4 extremities.  Sensory examination: Soft touch sensation is symmetric on the face, arms, and legs.  Coordination: The patient has good finger-nose-finger and heel-to-shin bilaterally.  Gait and station: The patient has a normal gait. Tandem gait is slightly unsteady. Romberg is negative. No drift is seen.  Reflexes: Deep tendon reflexes are symmetric.   Assessment/Plan:  1. Gait disturbance  2. Chronic daily headache  3. Dizziness  4. Abnormal EEG  The patient will be taken off of the Topamax if she is gaining no benefit with this. She will be placed on Depakote for migraine headache and for seizures. The patient will follow-up in 3-4 months. I  have indicated that she needs to be quite careful with her walking, she will call me if she is not tolerating the new medication.  Jill Alexanders MD 08/29/2014 8:15 PM  [facility] [address] [place], [place] [address]  Phone [phone] Fax [phone]

## 2014-08-29 NOTE — Patient Instructions (Addendum)
With the topamax, go to 50 mg at night for 2 weeks, then stop the medication. While you are coming off of the Topamax, we will be going on Depakote.  Fall Prevention and Home Safety Falls cause injuries and can affect all age groups. It is possible to use preventive measures to significantly decrease the likelihood of falls. There are many simple measures which can make your home safer and prevent falls. OUTDOORS  Repair cracks and edges of walkways and driveways.  Remove high doorway thresholds.  Trim shrubbery on the main path into your home.  Have good outside lighting.  Clear walkways of tools, rocks, debris, and clutter.  Check that handrails are not broken and are securely fastened. Both sides of steps should have handrails.  Have leaves, snow, and ice cleared regularly.  Use sand or salt on walkways during winter months.  In the garage, clean up grease or oil spills. BATHROOM  Install night lights.  Install grab bars by the toilet and in the tub and shower.  Use non-skid mats or decals in the tub or shower.  Place a plastic non-slip stool in the shower to sit on, if needed.  Keep floors dry and clean up all water on the floor immediately.  Remove soap buildup in the tub or shower on a regular basis.  Secure bath mats with non-slip, double-sided rug tape.  Remove throw rugs and tripping hazards from the floors. BEDROOMS  Install night lights.  Make sure a bedside light is easy to reach.  Do not use oversized bedding.  Keep a telephone by your bedside.  Have a firm chair with side arms to use for getting dressed.  Remove throw rugs and tripping hazards from the floor. KITCHEN  Keep handles on pots and pans turned toward the center of the stove. Use back burners when possible.  Clean up spills quickly and allow time for drying.  Avoid walking on wet floors.  Avoid hot utensils and knives.  Position shelves so they are not too high or low.  Place  commonly used objects within easy reach.  If necessary, use a sturdy step stool with a grab bar when reaching.  Keep electrical cables out of the way.  Do not use floor polish or wax that makes floors slippery. If you must use wax, use non-skid floor wax.  Remove throw rugs and tripping hazards from the floor. STAIRWAYS  Never leave objects on stairs.  Place handrails on both sides of stairways and use them. Fix any loose handrails. Make sure handrails on both sides of the stairways are as long as the stairs.  Check carpeting to make sure it is firmly attached along stairs. Make repairs to worn or loose carpet promptly.  Avoid placing throw rugs at the top or bottom of stairways, or properly secure the rug with carpet tape to prevent slippage. Get rid of throw rugs, if possible.  Have an electrician put in a light switch at the top and bottom of the stairs. OTHER FALL PREVENTION TIPS  Wear low-heel or rubber-soled shoes that are supportive and fit well. Wear closed toe shoes.  When using a stepladder, make sure it is fully opened and both spreaders are firmly locked. Do not climb a closed stepladder.  Add color or contrast paint or tape to grab bars and handrails in your home. Place contrasting color strips on first and last steps.  Learn and use mobility aids as needed. Install an electrical emergency response system.  Turn  on lights to avoid dark areas. Replace light bulbs that burn out immediately. Get light switches that glow.  Arrange furniture to create clear pathways. Keep furniture in the same place.  Firmly attach carpet with non-skid or double-sided tape.  Eliminate uneven floor surfaces.  Select a carpet pattern that does not visually hide the edge of steps.  Be aware of all pets. OTHER HOME SAFETY TIPS  Set the water temperature for 120 F (48.8 C).  Keep emergency numbers on or near the telephone.  Keep smoke detectors on every level of the home and near  sleeping areas. Document Released: 08/02/2002 Document Revised: 02/11/2012 Document Reviewed: 11/01/2011 [facility] Patient Information 2015 [place], [place]. This information is not intended to replace advice given to you by your health care provider. Make sure you discuss any questions you have with your health care provider.

## 2014-11-25 ENCOUNTER — Other Ambulatory Visit: Payer: Self-pay | Admitting: Neurology

## 2015-01-25 ENCOUNTER — Ambulatory Visit: Payer: PRIVATE HEALTH INSURANCE | Admitting: Neurology

## 2015-01-26 ENCOUNTER — Encounter: Payer: Self-pay | Admitting: Neurology

## 2015-01-26 ENCOUNTER — Ambulatory Visit (INDEPENDENT_AMBULATORY_CARE_PROVIDER_SITE_OTHER): Payer: Medicare Other | Admitting: Neurology

## 2015-01-26 VITALS — BP 90/58 | HR 84 | Ht 64.0 in | Wt 157.6 lb

## 2015-01-26 DIAGNOSIS — I420 Dilated cardiomyopathy: Secondary | ICD-10-CM

## 2015-01-26 DIAGNOSIS — E538 Deficiency of other specified B group vitamins: Secondary | ICD-10-CM

## 2015-01-26 DIAGNOSIS — R269 Unspecified abnormalities of gait and mobility: Secondary | ICD-10-CM

## 2015-01-26 DIAGNOSIS — R42 Dizziness and giddiness: Secondary | ICD-10-CM | POA: Diagnosis not present

## 2015-01-26 DIAGNOSIS — G43019 Migraine without aura, intractable, without status migrainosus: Secondary | ICD-10-CM | POA: Diagnosis not present

## 2015-01-26 MED ORDER — TOPIRAMATE 50 MG PO TABS: ORAL_TABLET | ORAL | Status: DC

## 2015-01-26 NOTE — Patient Instructions (Signed)

## 2015-01-26 NOTE — Progress Notes (Signed)
Reason for visit: Headache  Amy Glover is an 64 y.o. female  History of present illness:  Amy Glover is a 64 year old right-handed white female with a history of migraine headache. The patient was to go on Depakote when last seen, but she decided not to take the medication. She has remained on Topamax, and the dose was increased to 50 mg in the morning and 100 mg in the evening. Her headaches have done much better on this medication although she believes that the drug is resulting in hair loss. The patient is having only occasional headache at this point. She also has a history of an abnormal EEG, but she has not had any clinical seizure-type events. She does have a chronic issue with dizziness, she will take meclizine on a regular basis, usually taking 25 mg twice daily. The patient has continued to fall regularly, at least once a week. The patient has a cane, but she does not seem to use it properly. She has not responded to vestibular rehabilitation in the past. She returns for an evaluation at this time. She has never had physical therapy for gait training. Blood work done in December 2015 shows what appears to be a very low vitamin B12 level of 5, the patient believes that this may be an error, she was never told to go on any vitamin B12 supplementation.  Past Medical History  Diagnosis Date  . Brain aneurysm     right ACOM  . Dizziness and giddiness 12/07/2013  . Migraine without aura, without mention of intractable migraine without mention of status migrainosus 12/07/2013  . GERD (gastroesophageal reflux disease)   . Subclavian steal syndrome   . Hypertension   . Dyslipidemia     Past Surgical History  Procedure Laterality Date  . Brain surgery  04/20011    right ACOM aneurysm clip  . 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 Wrist surgery Right 2009  . Stent in neck  2009  . Tonsillectomy and adenoidectomy  1961  . Shoulder surgery  2008  . Cesarean section  1984  . Tubal  ligation  1987    Family History  Problem Relation Age of Onset  . Heart disease Mother   . Hypertension Father   . Cancer Father   . Multiple sclerosis Sister   . Seizures Sister     Social history:  reports that she has quit smoking. She has never used smokeless tobacco. She reports that she drinks alcohol. She reports that she does not use illicit drugs.    Allergies  Allergen Reactions  . Sulfa Antibiotics Hives and Nausea And Vomiting    Medications:  Prior to Admission medications   Medication Sig Start Date End Date Taking? Authorizing Provider  ALPRAZolam (XANAX) 0.25 MG tablet Take 0.25 mg by mouth 3 (three) times daily as needed. For anxiety   Yes Historical Provider, MD  aspirin EC 81 MG tablet Take 162 mg by mouth every evening.   Yes Historical Provider, MD  atorvastatin (LIPITOR) 40 MG tablet Take 40 mg by mouth daily.  11/19/13  Yes Historical Provider, MD  Calcium Carbonate-Vit D-Min (CALCIUM 1200 PO) Take 1 tablet by mouth daily.   Yes Historical Provider, MD  carvedilol (COREG) 6.25 MG tablet Take 3.125 mg by mouth 2 (two) times daily with a meal.    Yes Historical Provider, MD  cholecalciferol (VITAMIN D) 1000 UNITS tablet Take 5,000 Units by mouth daily.   Yes  Historical Provider, MD  fexofenadine (ALLEGRA) 180 MG tablet Take 90 mg by mouth as needed.    Yes Historical Provider, MD  Fluticasone Furoate-Vilanterol (BREO ELLIPTA) 100-25 MCG/INH AEPB Inhale 1 puff into the lungs daily.    Yes Historical Provider, MD  furosemide (LASIX) 40 MG tablet Take 40 mg by mouth daily.   Yes Historical Provider, MD  lisinopril (PRINIVIL,ZESTRIL) 5 MG tablet Take 5 mg by mouth every evening.    Yes Historical Provider, MD  Magnesium-Zinc 133.33-5 MG TABS Take 1 tablet by mouth daily.    Yes Historical Provider, MD  meclizine (ANTIVERT) 25 MG tablet Take 25 mg by mouth 3 (three) times daily as needed for dizziness. Patient takes at least one daily. 11/25/13  Yes Historical  Provider, MD  Multiple Vitamin (MULITIVITAMIN WITH MINERALS) TABS Take 1 tablet by mouth daily.   Yes Historical Provider, MD  naproxen (NAPROSYN) 500 MG tablet Take 500 mg by mouth 2 (two) times daily with a meal.   Yes Historical Provider, MD  Omega-3 Fatty Acids (FISH OIL) 1000 MG CAPS Take 1,000 mg by mouth 2 (two) times daily.   Yes Historical Provider, MD  omeprazole (PRILOSEC) 40 MG capsule Take 40 mg by mouth daily.   Yes Historical Provider, MD  potassium chloride SA (K-DUR,KLOR-CON) 20 MEQ tablet Take 40 mEq by mouth daily.    Yes Historical Provider, MD  promethazine (PHENERGAN) 25 MG tablet Take 25 mg by mouth every 8 (eight) hours as needed. For nausea   Yes Historical Provider, MD  Tiotropium Bromide Monohydrate 2.5 MCG/ACT AERS Inhale 2 puffs into the lungs daily.   Yes Historical Provider, MD  topiramate (TOPAMAX) 50 MG tablet Take 50 mg by mouth 2 (two) times daily.   Yes Historical Provider, MD  Vilazodone HCl (VIIBRYD) 40 MG TABS Take 40 mg by mouth daily.   Yes Historical Provider, MD  zolpidem (AMBIEN) 10 MG tablet Take 5 mg by mouth at bedtime as needed. For sleep   Yes Historical Provider, MD    ROS:  Out of a complete 14 system review of symptoms, the patient complains only of the following symptoms, and all other reviewed systems are negative.  Cough Gait disturbance Dizziness  Blood pressure 90/58, pulse 84, height 5\' 4"  (1.626 m), weight 157 lb 9.6 oz (71.487 kg).  Physical Exam  General: The patient is alert and cooperative at the time of the examination.  Skin: No significant peripheral edema is noted.   Neurologic Exam  Mental status: The patient is alert and oriented x 3 at the time of the examination. The patient has apparent normal recent and remote memory, with an apparently normal attention span and concentration ability.   Cranial nerves: Facial symmetry is present. Speech is normal, no aphasia or dysarthria is noted. Extraocular movements are  full. Visual fields are full.  Motor: The patient has good strength in all 4 extremities.  Sensory examination: Soft touch sensation is symmetric on the face, arms, and legs.  Coordination: The patient has good finger-nose-finger and heel-to-shin bilaterally.  Gait and station: The patient has a slightly wide-based gait. Tandem gait is unsteady.  Romberg is negative. No drift is seen.  Reflexes: Deep tendon reflexes are symmetric.   Assessment/Plan:   1. Gait disturbance  2. Chronic dizziness, vertigo  3. Migraine headache  4. Possible B12 deficiency  The patient will have blood work rechecked today for a vitamin B12 level. The patient will continue the Topamax at 50 mg in  the morning, 100 mg in the evening. She will be set up for physical therapy for gait training. She will follow-up through this office in 6 months. The patient has requested a referral to a local cardiologist for her cardiomyopathy. I will try to get this set up.  Jill Alexanders MD 01/26/2015 8:08 PM  [facility] [address] [place], [place] [address]  Phone [phone] Fax [phone]

## 2015-01-27 LAB — VITAMIN B12: Vitamin B-12: 554 pg/mL (ref 211–946)

## 2015-02-04 ENCOUNTER — Other Ambulatory Visit: Payer: Self-pay | Admitting: Adult Health

## 2015-04-06 ENCOUNTER — Encounter: Payer: Self-pay | Admitting: Cardiology

## 2015-04-06 ENCOUNTER — Ambulatory Visit (INDEPENDENT_AMBULATORY_CARE_PROVIDER_SITE_OTHER): Payer: Medicare Other | Admitting: Cardiology

## 2015-04-06 VITALS — BP 130/74 | HR 86 | Ht 64.0 in | Wt 153.2 lb

## 2015-04-06 DIAGNOSIS — R55 Syncope and collapse: Secondary | ICD-10-CM | POA: Insufficient documentation

## 2015-04-06 DIAGNOSIS — I420 Dilated cardiomyopathy: Secondary | ICD-10-CM

## 2015-04-06 DIAGNOSIS — E669 Obesity, unspecified: Secondary | ICD-10-CM | POA: Insufficient documentation

## 2015-04-06 DIAGNOSIS — I10 Essential (primary) hypertension: Secondary | ICD-10-CM

## 2015-04-06 DIAGNOSIS — M5136 Other intervertebral disc degeneration, lumbar region: Secondary | ICD-10-CM | POA: Insufficient documentation

## 2015-04-06 DIAGNOSIS — R296 Repeated falls: Secondary | ICD-10-CM | POA: Diagnosis not present

## 2015-04-06 DIAGNOSIS — R42 Dizziness and giddiness: Secondary | ICD-10-CM

## 2015-04-06 DIAGNOSIS — G894 Chronic pain syndrome: Secondary | ICD-10-CM | POA: Insufficient documentation

## 2015-04-06 DIAGNOSIS — E66811 Obesity, class 1: Secondary | ICD-10-CM | POA: Insufficient documentation

## 2015-04-06 DIAGNOSIS — M47816 Spondylosis without myelopathy or radiculopathy, lumbar region: Secondary | ICD-10-CM | POA: Insufficient documentation

## 2015-04-06 DIAGNOSIS — M7060 Trochanteric bursitis, unspecified hip: Secondary | ICD-10-CM | POA: Insufficient documentation

## 2015-04-06 NOTE — Patient Instructions (Signed)
Medication Instructions:   Your physician recommends that you continue on your current medications as directed. Please refer to the Current Medication list given to you today.     Testing/Procedures:  Your physician has recommended that you wear an event monitor. Event monitors are medical devices that record the heart's electrical activity. Doctors most often [place] these monitors to diagnose arrhythmias. Arrhythmias are problems with the speed or rhythm of the heartbeat. The monitor is a small, portable device. You can wear one while you do your normal daily activities. This is usually used to diagnose what is causing palpitations/syncope (passing out).     Follow-Up:  4 MONTHS WITH DR Meda Coffee

## 2015-04-06 NOTE — Progress Notes (Signed)
Patient ID: Amy Glover, female   DOB: [birth year], 64 y.o.   MRN: [id number]      Cardiology Office Note  Date:  04/06/2015   ID:  Amy Glover, DOB 07-13-1951, MRN [id number]  PCP:  Bonnita Nasuti, MD  Cardiologist:   Dorothy Spark, MD   Chief complain: Dizziness, falls   History of Present Illness: Amy Glover is a 64 y.o. female who presents for evaluation of dizziness and falls. She has a very complicated history, she was diagnosed with idiopathic cardiomyopathy in 2001, she has negative stress test and cardiac cath. She states that her LVEF was always > 50% but she has symptoms of CHF. She was going through significant stress at that time and it is unclear if she potentially had stress induced CMP or myocarditis. She was started on BB and ACEI and lasix. Her most recent echocardiogram showed normal biventricular size and function and no significant valvular abnormalities (mild MR and TR).  She had a negative exercise nuclear stress test in 2010.  In 2009 she developed recurrent syncope --> subclavian steal syndrome --> stent placed in 2009, resolved symptoms, later on she developed dizziness and was diagnosed with a brain aneurysm and underwent clipping in [place] in 2011 in [place]. Her symptoms got worse postop, she stopped working as she lost part of her short tem memory.  She feels dizzy daily and occasionally has significant falls. The last episode was when she woke up with weakness in the left foot and fell three times. She has no recollection of the episode.  She denies any palpitations. No chest pain. No DOE.  Most recent labs from 04/03/15 - TSH 1.4HbA1c 5.0%, CBC normal, CMP normal, HDL 50, LDL 66, TAG 154.  Past Medical History  Diagnosis Date  . Brain aneurysm     right ACOM  . Dizziness and giddiness 12/07/2013  . Migraine without aura, without mention of intractable migraine without mention of status migrainosus 12/07/2013  . GERD (gastroesophageal reflux  disease)   . Subclavian steal syndrome   . Hypertension   . Dyslipidemia     Past Surgical History  Procedure Laterality Date  . Brain surgery  04/20011    right ACOM aneurysm clip  . 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 Wrist surgery Right 2009  . Stent in neck  2009  . Tonsillectomy and adenoidectomy  1961  . Shoulder surgery  2008  . Cesarean section  1984  . Tubal ligation  1987     Current Outpatient Prescriptions  Medication Sig Dispense Refill  . ALPRAZolam (XANAX) 0.25 MG tablet Take 0.25-0.5 mg by mouth 2 (two) times daily as needed for anxiety (with meclizine for anxiety and dizziness).    Marland Kitchen aspirin EC 81 MG tablet Take 162 mg by mouth every evening.    Marland Kitchen atorvastatin (LIPITOR) 40 MG tablet Take 40 mg by mouth daily.     . Calcium Carbonate-Vit D-Min (CALCIUM 1200 PO) Take 1,200 Units by mouth daily.     . carvedilol (COREG) 6.25 MG tablet Take 3.125 mg by mouth 2 (two) times daily with a meal.     . Cholecalciferol (VITAMIN D3) 5000 UNITS TABS Take 1 tablet by mouth daily.    . fexofenadine (ALLEGRA) 180 MG tablet Take 90 mg by mouth as needed.     . Fluticasone Furoate-Vilanterol (BREO ELLIPTA) 100-25 MCG/INH AEPB Inhale 1 puff into the lungs daily.     . furosemide (  LASIX) 40 MG tablet Take 40 mg by mouth daily.    Marland Kitchen lisinopril (PRINIVIL,ZESTRIL) 10 MG tablet Take 5 mg by mouth daily.  2  . meclizine (ANTIVERT) 25 MG tablet Take 25 mg by mouth 3 (three) times daily as needed for dizziness. Patient takes at least one daily.    . Multiple Vitamin (MULITIVITAMIN WITH MINERALS) TABS Take 1 tablet by mouth daily.    . naproxen (NAPROSYN) 500 MG tablet Take 500 mg by mouth 2 (two) times daily with a meal.    . Omega-3 Fatty Acids (FISH OIL) 1000 MG CAPS Take 1,000 mg by mouth 2 (two) times daily.    Marland Kitchen omeprazole (PRILOSEC) 40 MG capsule Take 40 mg by mouth daily.    . potassium chloride SA (K-DUR,KLOR-CON) 20 MEQ tablet Take 40 mEq by mouth daily.     . promethazine  (PHENERGAN) 25 MG tablet Take 25 mg by mouth every 8 (eight) hours as needed. For nausea    . Tiotropium Bromide Monohydrate (SPIRIVA RESPIMAT) 2.5 MCG/ACT AERS Inhale 2 puffs into the lungs daily.     Marland Kitchen topiramate (TOPAMAX) 50 MG tablet Take 50-100 mg by mouth 2 (two) times daily. 100mg  in the am, 50mg  in the pm    . Vilazodone HCl (VIIBRYD) 40 MG TABS Take 40 mg by mouth daily.    Marland Kitchen zolpidem (AMBIEN) 10 MG tablet Take 5 mg by mouth at bedtime as needed. For sleep     No current facility-administered medications for this visit.    Allergies:   Sulfa antibiotics    Social History:  The patient  reports that she has quit smoking. She has never used smokeless tobacco. She reports that she drinks alcohol. She reports that she does not use illicit drugs.   Family History:  The patient's family history includes Cancer in her father; Heart disease in her mother; Hypertension in her father; Multiple sclerosis in her sister; Seizures in her sister.    ROS:  Please see the history of present illness.   Otherwise, review of systems are positive for none.   All other systems are reviewed and negative.    PHYSICAL EXAM: VS:  BP 130/74 mmHg  Pulse 86  Ht 5\' 4"  (1.626 m)  Wt 153 lb 3.2 oz (69.491 kg)  BMI 26.28 kg/m2 , BMI Body mass index is 26.28 kg/(m^2). GEN: Well nourished, well developed, in no acute distress HEENT: normal Neck: no JVD, carotid bruits, or masses Cardiac: RRR; no murmurs, rubs, or gallops,no edema  Respiratory:  clear to auscultation bilaterally, normal work of breathing GI: soft, nontender, nondistended, + BS MS: no deformity or atrophy Skin: warm and dry, no rash Neuro:  Strength and sensation are intact Psych: euthymic mood, full affect   EKG:  EKG is ordered today. The ekg ordered today demonstrates SR, normal ECG   Recent Labs: No results found for requested labs within last 365 days.    Lipid Panel No results found for: CHOL, TRIG, HDL, CHOLHDL, VLDL,  LDLCALC, LDLDIRECT    Wt Readings from Last 3 Encounters:  04/06/15 153 lb 3.2 oz (69.491 kg)  01/26/15 157 lb 9.6 oz (71.487 kg)  08/29/14 172 lb 6.4 oz (78.2 kg)      ASSESSMENT AND PLAN:  1.  H/o non-ischemic cardiomyopathy of unknown etiology - currently normal LVEF, she is euvolemic, I would continue the same regimen with carvedilol, lisinopril.  2. Dizziness, falls - never palpitations, it appear that her symptoms are related to  neurologic impairment post brain aneurysm clipping.  We will start 30 day monitor to evaluate for atrial fibrillation or other potential arrhythmias or pauses.   3. HTN - controlled  4. HLP - on atorvastatin 40 mg po daily, at goal.  Orders Placed This Encounter  Procedures  . Cardiac event monitor  . EKG 12-Lead   Follow up in 4 months.  Signed, Dorothy Spark, MD  04/06/2015 5:07 PM    [facility] [place], [place], [place]  [address] Phone: [phone]; Fax: [phone]

## 2015-04-13 ENCOUNTER — Ambulatory Visit (INDEPENDENT_AMBULATORY_CARE_PROVIDER_SITE_OTHER): Payer: Medicare Other

## 2015-04-13 DIAGNOSIS — I10 Essential (primary) hypertension: Secondary | ICD-10-CM | POA: Diagnosis not present

## 2015-04-13 DIAGNOSIS — R42 Dizziness and giddiness: Secondary | ICD-10-CM | POA: Diagnosis not present

## 2015-04-13 DIAGNOSIS — I420 Dilated cardiomyopathy: Secondary | ICD-10-CM

## 2015-04-13 DIAGNOSIS — R296 Repeated falls: Secondary | ICD-10-CM | POA: Diagnosis not present

## 2015-07-05 ENCOUNTER — Encounter: Payer: Self-pay | Admitting: Cardiology

## 2015-08-08 ENCOUNTER — Encounter: Payer: Self-pay | Admitting: Neurology

## 2015-08-08 ENCOUNTER — Ambulatory Visit (INDEPENDENT_AMBULATORY_CARE_PROVIDER_SITE_OTHER): Payer: Medicare Other | Admitting: Neurology

## 2015-08-08 VITALS — BP 107/60 | HR 74 | Ht 64.0 in | Wt 156.0 lb

## 2015-08-08 DIAGNOSIS — G43019 Migraine without aura, intractable, without status migrainosus: Secondary | ICD-10-CM

## 2015-08-08 DIAGNOSIS — R42 Dizziness and giddiness: Secondary | ICD-10-CM | POA: Diagnosis not present

## 2015-08-08 DIAGNOSIS — R269 Unspecified abnormalities of gait and mobility: Secondary | ICD-10-CM | POA: Diagnosis not present

## 2015-08-08 HISTORY — DX: Unspecified abnormalities of gait and mobility: R26.9

## 2015-08-08 NOTE — Patient Instructions (Signed)

## 2015-08-08 NOTE — Progress Notes (Signed)
Reason for visit: Headache  Amy Glover is an 64 y.o. female  History of present illness:  Amy Glover is a 64 year old right-handed white female with a history of headaches, history of a prior right anterior communicating artery aneurysm. The patient has been placed on Topamax, currently on a total of 150 mg a day. The patient has had some chronic gait instability, she has undergone some physical therapy with some benefit. She does fall on occasion. The headaches are often present in the morning, she will have 5 out of 7 days in the week with a early morning headache. The headache is better within half an hour or 45 minutes after getting out of bed. The patient takes Sudafed on a regular basis. She will take 1 or 2 tablets every day. She drinks one half of coffee a day, no other caffeinated products. The headaches are described as a pressure sensation the front of the head with some nasal stuffiness. The patient takes Zyrtec in the evening hours. She returns to this office for an evaluation.  Past Medical History  Diagnosis Date  . Brain aneurysm     right ACOM  . Dizziness and giddiness 12/07/2013  . Migraine without aura, without mention of intractable migraine without mention of status migrainosus 12/07/2013  . GERD (gastroesophageal reflux disease)   . Subclavian steal syndrome   . Hypertension   . Dyslipidemia   . Abnormality of gait 08/08/2015    Past Surgical History  Procedure Laterality Date  . Brain surgery  04/20011    right ACOM aneurysm clip  . 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 Wrist surgery Right 2009  . Stent in neck  2009  . Tonsillectomy and adenoidectomy  1961  . Shoulder surgery  2008  . Cesarean section  1984  . Tubal ligation  1987    Family History  Problem Relation Age of Onset  . Heart disease Mother   . Hypertension Father   . Cancer Father   . Multiple sclerosis Sister   . Seizures Sister     Social history:  reports that she has quit  smoking. She has never used smokeless tobacco. She reports that she drinks alcohol. She reports that she does not use illicit drugs.    Allergies  Allergen Reactions  . Sulfa Antibiotics Hives and Nausea And Vomiting    Medications:  Prior to Admission medications   Medication Sig Start Date End Date Taking? Authorizing Provider  ALPRAZolam (XANAX) 0.25 MG tablet Take 0.25-0.5 mg by mouth 2 (two) times daily as needed for anxiety (with meclizine for anxiety and dizziness).   Yes Historical Provider, MD  aspirin EC 81 MG tablet Take 162 mg by mouth every evening.   Yes Historical Provider, MD  atorvastatin (LIPITOR) 40 MG tablet Take 40 mg by mouth daily.  11/19/13  Yes Historical Provider, MD  b complex vitamins tablet Take 1 tablet by mouth daily.   Yes Historical Provider, MD  Biotin (BIOTIN MAXIMUM STRENGTH) 10 MG TABS Take 1 tablet by mouth daily.   Yes Historical Provider, MD  Calcium Carbonate-Vit D-Min (CALCIUM 1200 PO) Take 1,200 Units by mouth daily.    Yes Historical Provider, MD  carvedilol (COREG) 6.25 MG tablet Take 3.125 mg by mouth 2 (two) times daily with a meal.    Yes Historical Provider, MD  cetirizine (ZYRTEC) 10 MG tablet Take 10 mg by mouth daily.   Yes Historical Provider, MD  Cholecalciferol (  VITAMIN D3) 5000 UNITS TABS Take 1 tablet by mouth daily.   Yes Historical Provider, MD  Fluticasone Furoate-Vilanterol (BREO ELLIPTA) 100-25 MCG/INH AEPB Inhale 1 puff into the lungs daily.    Yes Historical Provider, MD  furosemide (LASIX) 40 MG tablet Take 40 mg by mouth daily.   Yes Historical Provider, MD  ibuprofen (ADVIL,MOTRIN) 400 MG tablet Take 400 mg by mouth every 6 (six) hours as needed.   Yes Historical Provider, MD  lisinopril (PRINIVIL,ZESTRIL) 10 MG tablet Take 5 mg by mouth daily. 01/31/15  Yes Historical Provider, MD  MAGNESIUM PO Take 1 tablet by mouth daily.   Yes Historical Provider, MD  meclizine (ANTIVERT) 25 MG tablet Take 25 mg by mouth 3 (three) times  daily as needed for dizziness. Patient takes at least one daily. 11/25/13  Yes Historical Provider, MD  Multiple Vitamin (MULITIVITAMIN WITH MINERALS) TABS Take 1 tablet by mouth daily.   Yes Historical Provider, MD  naproxen (NAPROSYN) 500 MG tablet Take 500 mg by mouth 2 (two) times daily with a meal.   Yes Historical Provider, MD  Omega-3 Fatty Acids (FISH OIL) 1000 MG CAPS Take 1,000 mg by mouth 2 (two) times daily.   Yes Historical Provider, MD  omeprazole (PRILOSEC) 40 MG capsule Take 40 mg by mouth daily.   Yes Historical Provider, MD  potassium chloride SA (K-DUR,KLOR-CON) 20 MEQ tablet Take 40 mEq by mouth daily.    Yes Historical Provider, MD  promethazine (PHENERGAN) 25 MG tablet Take 25 mg by mouth every 8 (eight) hours as needed. For nausea   Yes Historical Provider, MD  Tiotropium Bromide Monohydrate (SPIRIVA RESPIMAT) 2.5 MCG/ACT AERS Inhale 2 puffs into the lungs daily.    Yes Historical Provider, MD  topiramate (TOPAMAX) 50 MG tablet Take 50-100 mg by mouth 2 (two) times daily. 100mg  in the am, 50mg  in the pm   Yes Historical Provider, MD  UNABLE TO FIND Place 2 mg vaginally daily. Med Name: Estrace cream   Yes Historical Provider, MD  UNABLE TO FIND Med Name:   Yes Historical Provider, MD  UNABLE TO FIND Take 60 mg by mouth daily. Med Name: Suprema Dophilus   Yes Historical Provider, MD  Vilazodone HCl (VIIBRYD) 40 MG TABS Take 40 mg by mouth daily.   Yes Historical Provider, MD  zolpidem (AMBIEN) 10 MG tablet Take 5 mg by mouth at bedtime as needed. For sleep   Yes Historical Provider, MD    ROS:  Out of a complete 14 system review of symptoms, the patient complains only of the following symptoms, and all other reviewed systems are negative.  Ringing in the ears Eyes itching, eye redness Insomnia, snoring Joint pain, back pain, walking difficulty, neck pain Memory loss, dizziness, headache Confusion, depression, anxiety  Blood pressure 107/60, pulse 74, height 5\' 4"  (1.626  m), weight 156 lb (70.761 kg).  Physical Exam  General: The patient is alert and cooperative at the time of the examination.  Skin: No significant peripheral edema is noted.   Neurologic Exam  Mental status: The patient is alert and oriented x 3 at the time of the examination. The patient has apparent normal recent and remote memory, with an apparently normal attention span and concentration ability.   Cranial nerves: Facial symmetry is present. Speech is normal, no aphasia or dysarthria is noted. Extraocular movements are full. Visual fields are full.  Motor: The patient has good strength in all 4 extremities.  Sensory examination: Soft touch sensation is  symmetric on the face, arms, and legs.  Coordination: The patient has good finger-nose-finger and heel-to-shin bilaterally.  Gait and station: The patient has a normal gait. Tandem gait is unsteady. Romberg is negative. No drift is seen.  Reflexes: Deep tendon reflexes are symmetric.   Assessment/Plan:  1. Migraine headache  2. Gait instability  3. Early morning headaches, possible rebound  The patient is taking Sudafed on a regular basis, she may be rebound from. I have asked her to taper off of the Sudafed, and allow for 4-6 weeks to improve with the headache. The patient will continue on the Topamax, we could increase the dose to 100 mg twice daily in the future if needed. The patient otherwise will follow-up in 6 months, sooner if needed.  Jill Alexanders MD 08/08/2015 6:40 PM  [facility] [address] [place], [place] [address]  Phone [phone] Fax [phone]

## 2015-08-10 ENCOUNTER — Ambulatory Visit: Payer: Medicare Other | Admitting: Cardiology

## 2015-08-15 ENCOUNTER — Ambulatory Visit (INDEPENDENT_AMBULATORY_CARE_PROVIDER_SITE_OTHER): Payer: Medicare Other | Admitting: Cardiology

## 2015-08-15 ENCOUNTER — Encounter: Payer: Self-pay | Admitting: Cardiology

## 2015-08-15 VITALS — BP 110/60 | HR 84 | Ht 64.0 in | Wt 154.0 lb

## 2015-08-15 DIAGNOSIS — I429 Cardiomyopathy, unspecified: Secondary | ICD-10-CM | POA: Diagnosis not present

## 2015-08-15 DIAGNOSIS — R55 Syncope and collapse: Secondary | ICD-10-CM | POA: Diagnosis not present

## 2015-08-15 DIAGNOSIS — E785 Hyperlipidemia, unspecified: Secondary | ICD-10-CM | POA: Diagnosis not present

## 2015-08-15 DIAGNOSIS — I428 Other cardiomyopathies: Secondary | ICD-10-CM

## 2015-08-15 DIAGNOSIS — I119 Hypertensive heart disease without heart failure: Secondary | ICD-10-CM | POA: Insufficient documentation

## 2015-08-15 NOTE — Patient Instructions (Signed)
Medication Instructions:   Your physician recommends that you continue on your current medications as directed. Please refer to the Current Medication list given to you today.    Testing/Procedures:  Your physician has requested that you have an echocardiogram. Echocardiography is a painless test that uses sound waves to create images of your heart. It provides your doctor with information about the size and shape of your heart and how well your heart's chambers and valves are working. This procedure takes approximately one hour. There are no restrictions for this procedure.    Follow-Up:  Your physician wants you to follow-up in: 6 MONTHS WITH DR NELSON You will receive a reminder letter in the mail two months in advance. If you don't receive a letter, please call our office to schedule the follow-up appointment.      If you need a refill on your cardiac medications before your next appointment, please call your pharmacy.   

## 2015-08-15 NOTE — Progress Notes (Signed)
Patient ID: Amy Glover, female   DOB: July 02, 1951, 64 y.o.   MRN: [id number]      Cardiology Office Note  Date:  08/15/2015   ID:  Amy Glover, DOB 08-20-1951, MRN [id number]  PCP:  Bonnita Nasuti, MD  Cardiologist:   Dorothy Spark, MD   Chief complain: Dizziness, falls   History of Present Illness: Amy Glover is a 64 y.o. female who presents for evaluation of dizziness and falls. She has a very complicated history, she was diagnosed with idiopathic cardiomyopathy in 2001, she has negative stress test and cardiac cath. She states that her LVEF was always > 50% but she has symptoms of CHF. She was going through significant stress at that time and it is unclear if she potentially had stress induced CMP or myocarditis. She was started on BB and ACEI and lasix. Her most recent echocardiogram showed normal biventricular size and function and no significant valvular abnormalities (mild MR and TR).  She had a negative exercise nuclear stress test in 2010.  In 2009 she developed recurrent syncope --> subclavian steal syndrome --> stent placed in 2009, resolved symptoms, later on she developed dizziness and was diagnosed with a brain aneurysm and underwent clipping in [place] in 2011 in [place]. Her symptoms got worse postop, she stopped working as she lost part of her short tem memory.  She feels dizzy daily and occasionally has significant falls. The last episode was when she woke up with weakness in the left foot and fell three times. She has no recollection of the episode.  She denies any palpitations. No chest pain. No DOE.  Most recent labs from 04/03/15 - TSH 1.4HbA1c 5.0%, CBC normal, CMP normal, HDL 50, LDL 66, TAG 154.  Since the last visit she felt 3x in the last month. Her LVEF was normal on the most recent echocardiogram and her event monitor was normal, however she didn't have any episode during the recording time. Denies LE edema, orthopnea, PND.   Past Medical History    Diagnosis Date  . Brain aneurysm     right ACOM  . Dizziness and giddiness 12/07/2013  . Migraine without aura, without mention of intractable migraine without mention of status migrainosus 12/07/2013  . GERD (gastroesophageal reflux disease)   . Subclavian steal syndrome   . Hypertension   . Dyslipidemia   . Abnormality of gait 08/08/2015    Past Surgical History  Procedure Laterality Date  . Brain surgery  04/20011    right ACOM aneurysm clip  . 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 Wrist surgery Right 2009  . Stent in neck  2009  . Tonsillectomy and adenoidectomy  1961  . Shoulder surgery  2008  . Cesarean section  1984  . Tubal ligation  1987     Current Outpatient Prescriptions  Medication Sig Dispense Refill  . ALPRAZolam (XANAX) 0.25 MG tablet Take 0.25-0.5 mg by mouth 2 (two) times daily as needed for anxiety (with meclizine for anxiety and dizziness).    Marland Kitchen aspirin EC 81 MG tablet Take 162 mg by mouth every evening.    Marland Kitchen atorvastatin (LIPITOR) 40 MG tablet Take 40 mg by mouth daily.     Marland Kitchen b complex vitamins tablet Take 1 tablet by mouth daily.    . Biotin (BIOTIN MAXIMUM STRENGTH) 10 MG TABS Take 1 tablet by mouth daily.    . Calcium Carbonate-Vit D-Min (CALCIUM 1200 PO) Take 1,200 Units by mouth  daily.     . carvedilol (COREG) 6.25 MG tablet Take 3.125 mg by mouth 2 (two) times daily with a meal.     . cetirizine (ZYRTEC) 10 MG tablet Take 10 mg by mouth daily.    . Cholecalciferol (VITAMIN D3) 5000 UNITS TABS Take 1 tablet by mouth daily.    Marland Kitchen estradiol (ESTRACE) 1 MG tablet Take 1 mg by mouth daily.    . Fluticasone Furoate-Vilanterol (BREO ELLIPTA) 100-25 MCG/INH AEPB Inhale 1 puff into the lungs daily.     . furosemide (LASIX) 40 MG tablet Take 40 mg by mouth daily.    Marland Kitchen ibuprofen (ADVIL,MOTRIN) 400 MG tablet Take 400 mg by mouth every 6 (six) hours as needed.    . Lactobacillus (PROBIOTIC ACIDOPHILUS PO) Take 60 mg by mouth daily.    Marland Kitchen lisinopril  (PRINIVIL,ZESTRIL) 10 MG tablet Take 5 mg by mouth daily.  2  . meclizine (ANTIVERT) 25 MG tablet Take 25 mg by mouth 3 (three) times daily as needed for dizziness. Patient takes at least one daily.    . Multiple Vitamin (MULITIVITAMIN WITH MINERALS) TABS Take 1 tablet by mouth daily.    . naproxen (NAPROSYN) 500 MG tablet Take 500 mg by mouth 2 (two) times daily with a meal.    . Omega-3 Fatty Acids (FISH OIL) 1000 MG CAPS Take 1,000 mg by mouth 2 (two) times daily.    Marland Kitchen omeprazole (PRILOSEC) 40 MG capsule Take 40 mg by mouth daily.    . potassium chloride SA (K-DUR,KLOR-CON) 20 MEQ tablet Take 40 mEq by mouth daily.     . promethazine (PHENERGAN) 25 MG tablet Take 25 mg by mouth every 8 (eight) hours as needed. For nausea    . Tiotropium Bromide Monohydrate (SPIRIVA RESPIMAT) 2.5 MCG/ACT AERS Inhale 2 puffs into the lungs daily.     Marland Kitchen topiramate (TOPAMAX) 50 MG tablet Take 50-100 mg by mouth 2 (two) times daily. 100mg  in the am, 50mg  in the pm    . Vilazodone HCl (VIIBRYD) 40 MG TABS Take 40 mg by mouth daily.    Marland Kitchen zolpidem (AMBIEN) 10 MG tablet Take 5 mg by mouth at bedtime as needed. For sleep     No current facility-administered medications for this visit.    Allergies:   Sulfa antibiotics    Social History:  The patient  reports that she has quit smoking. She has never used smokeless tobacco. She reports that she drinks alcohol. She reports that she does not use illicit drugs.   Family History:  The patient's family history includes Cancer in her father; Heart disease in her mother; Hypertension in her father; Multiple sclerosis in her sister; Seizures in her sister.    ROS:  Please see the history of present illness.   Otherwise, review of systems are positive for none.   All other systems are reviewed and negative.    PHYSICAL EXAM: VS:  BP 110/60 mmHg  Pulse 84  Ht 5\' 4"  (1.626 m)  Wt 154 lb (69.854 kg)  BMI 26.42 kg/m2 , BMI Body mass index is 26.42 kg/(m^2). GEN: Well  nourished, well developed, in no acute distress HEENT: normal Neck: no JVD, carotid bruits, or masses Cardiac: RRR; no murmurs, rubs, or gallops,no edema  Respiratory:  clear to auscultation bilaterally, normal work of breathing GI: soft, nontender, nondistended, + BS MS: no deformity or atrophy Skin: warm and dry, no rash Neuro:  Strength and sensation are intact Psych: euthymic mood, full affect  EKG:  EKG is ordered today. The ekg ordered today demonstrates SR, normal ECG   Recent Labs: No results found for requested labs within last 365 days.    Lipid Panel No results found for: CHOL, TRIG, HDL, CHOLHDL, VLDL, LDLCALC, LDLDIRECT    Wt Readings from Last 3 Encounters:  08/15/15 154 lb (69.854 kg)  08/08/15 156 lb (70.761 kg)  04/06/15 153 lb 3.2 oz (69.491 kg)      ASSESSMENT AND PLAN:  1.  H/o non-ischemic cardiomyopathy of unknown etiology - currently normal LVEF, she is euvolemic, I would continue the same regimen with carvedilol, lisinopril. We will order echo since we don't have any in our system.  2. Dizziness, falls - never palpitations, it appear that her symptoms are related to neurologic impairment post brain aneurysm clipping.  30 day monitor negative for atrial fibrillation or other potential arrhythmias or pauses. No episodes of syncope during the monitoring time. Normal carotid US B/L in August 2016.   3. HTN - controlled  4. HLP - on atorvastatin 40 mg po daily, at goal.  Follow up in 6 months.  Signed, Dorothy Spark, MD  08/15/2015 4:00 PM    [facility] [place], [place], [place]  [address] Phone: [phone]; Fax: [phone]

## 2015-08-27 HISTORY — PX: SHOULDER ARTHROSCOPY W/ ROTATOR CUFF REPAIR: SHX2400

## 2015-08-29 ENCOUNTER — Encounter: Payer: Self-pay | Admitting: Neurology

## 2015-09-01 ENCOUNTER — Other Ambulatory Visit (HOSPITAL_COMMUNITY): Payer: Medicare Other

## 2015-09-08 ENCOUNTER — Other Ambulatory Visit (HOSPITAL_COMMUNITY): Payer: Medicare Other

## 2015-09-22 ENCOUNTER — Other Ambulatory Visit: Payer: Self-pay

## 2015-09-22 ENCOUNTER — Ambulatory Visit (HOSPITAL_COMMUNITY): Payer: Medicare Other | Attending: Cardiology

## 2015-09-22 DIAGNOSIS — E785 Hyperlipidemia, unspecified: Secondary | ICD-10-CM

## 2015-09-22 DIAGNOSIS — R55 Syncope and collapse: Secondary | ICD-10-CM | POA: Insufficient documentation

## 2015-09-22 DIAGNOSIS — I517 Cardiomegaly: Secondary | ICD-10-CM | POA: Insufficient documentation

## 2015-09-22 DIAGNOSIS — I119 Hypertensive heart disease without heart failure: Secondary | ICD-10-CM | POA: Insufficient documentation

## 2015-09-22 DIAGNOSIS — I429 Cardiomyopathy, unspecified: Secondary | ICD-10-CM | POA: Diagnosis not present

## 2015-09-22 DIAGNOSIS — I10 Essential (primary) hypertension: Secondary | ICD-10-CM | POA: Insufficient documentation

## 2015-09-22 DIAGNOSIS — I340 Nonrheumatic mitral (valve) insufficiency: Secondary | ICD-10-CM | POA: Diagnosis not present

## 2015-09-22 DIAGNOSIS — I428 Other cardiomyopathies: Secondary | ICD-10-CM

## 2016-02-08 ENCOUNTER — Ambulatory Visit: Payer: Medicare Other | Admitting: Neurology

## 2016-03-05 ENCOUNTER — Ambulatory Visit: Payer: Self-pay | Admitting: Neurology

## 2016-03-12 ENCOUNTER — Telehealth (HOSPITAL_COMMUNITY): Payer: Self-pay

## 2016-03-12 NOTE — Telephone Encounter (Signed)
Called to schedule, left message with husband to call back and schedule 2 yr f/u MRI. AW

## 2016-03-18 ENCOUNTER — Other Ambulatory Visit (HOSPITAL_COMMUNITY): Payer: Self-pay | Admitting: Interventional Radiology

## 2016-03-18 DIAGNOSIS — I729 Aneurysm of unspecified site: Secondary | ICD-10-CM

## 2016-03-26 ENCOUNTER — Ambulatory Visit (INDEPENDENT_AMBULATORY_CARE_PROVIDER_SITE_OTHER): Payer: Medicare Other | Admitting: Neurology

## 2016-03-26 ENCOUNTER — Encounter: Payer: Self-pay | Admitting: Neurology

## 2016-03-26 VITALS — BP 98/68 | HR 78 | Ht 64.0 in | Wt 158.5 lb

## 2016-03-26 DIAGNOSIS — G43019 Migraine without aura, intractable, without status migrainosus: Secondary | ICD-10-CM

## 2016-03-26 DIAGNOSIS — R296 Repeated falls: Secondary | ICD-10-CM | POA: Diagnosis not present

## 2016-03-26 DIAGNOSIS — R269 Unspecified abnormalities of gait and mobility: Secondary | ICD-10-CM | POA: Diagnosis not present

## 2016-03-26 DIAGNOSIS — R404 Transient alteration of awareness: Secondary | ICD-10-CM | POA: Diagnosis not present

## 2016-03-26 MED ORDER — TOPIRAMATE 100 MG PO TABS: 100.0000 mg | ORAL_TABLET | Freq: Two times a day (BID) | ORAL | 3 refills | Status: DC

## 2016-03-26 NOTE — Patient Instructions (Signed)
   Increase the Topamax to 100 mg twice a day.

## 2016-03-26 NOTE — Progress Notes (Signed)
Reason for visit: Headache, gait disorder  Amy Glover is an 65 y.o. female  History of present illness:  Amy Glover is a 65 year old right-handed white female with a history of a cerebral aneurysm involving the right anterior communicating artery. The patient is followed by Dr. Estanislado Pandy, she is to have a repeat MRI of the brain and MRA of the head in the near future. The patient has been on Topamax, she has had an improvement in her migraine headaches, she has on average one headache a week which is excellent for her. The headaches are much less frequent than usual. The patient has fallen in January 2017, she sustained a right rotator cuff tear that required surgery. She has been in physical therapy for this. The patient is having episodes where she will have alteration of awareness, she will have a tendency to veer to the right when she is walking, with episodes of feeling "out of body" may occur while sitting or lying down as well. The patient has had episodes of vertigo that may occur independent of her headaches or of the episodes of falling that have been present for number of years, these episodes are becoming more frequent as well. The patient reports some ulnar distribution numbness of the right hand since the fall. The patient comes to this office for an evaluation.  Past Medical History:  Diagnosis Date  . Abnormality of gait 08/08/2015  . Brain aneurysm    right ACOM  . Dizziness and giddiness 12/07/2013  . Dyslipidemia   . GERD (gastroesophageal reflux disease)   . Hypertension   . Migraine without aura, without mention of intractable migraine without mention of status migrainosus 12/07/2013  . Subclavian steal syndrome     Past Surgical History:  Procedure Laterality Date  . BASIL CELL REMOVAL  2015   NOSE  . BRAIN SURGERY  04/20011   right ACOM aneurysm clip  . CESAREAN SECTION  1984  . SHOULDER SURGERY  2008  . STENT IN NECK  2009  . TONSILLECTOMY AND  ADENOIDECTOMY  1961  . TUBAL LIGATION  1987  . WRIST SURGERY Right 2009    Family History  Problem Relation Age of Onset  . Heart disease Mother   . Hypertension Father   . Cancer Father   . Multiple sclerosis Sister   . Seizures Sister     Social history:  reports that she has quit smoking. She has never used smokeless tobacco. She reports that she drinks alcohol. She reports that she does not use drugs.    Allergies  Allergen Reactions  . Sulfa Antibiotics Hives and Nausea And Vomiting    Medications:  Prior to Admission medications   Medication Sig Start Date End Date Taking? Authorizing Provider  ALPRAZolam (XANAX) 0.25 MG tablet Take 0.25-0.5 mg by mouth 2 (two) times daily as needed for anxiety (with meclizine for anxiety and dizziness).   Yes Historical Provider, MD  aspirin EC 81 MG tablet Take 162 mg by mouth every evening.   Yes Historical Provider, MD  atorvastatin (LIPITOR) 40 MG tablet Take 40 mg by mouth daily.  11/19/13  Yes Historical Provider, MD  b complex vitamins tablet Take 1 tablet by mouth daily.   Yes Historical Provider, MD  Biotin (BIOTIN MAXIMUM STRENGTH) 10 MG TABS Take 1 tablet by mouth daily.   Yes Historical Provider, MD  Calcium Carbonate-Vit D-Min (CALCIUM 1200 PO) Take 1,200 Units by mouth daily.    Yes Historical Provider, MD  carvedilol (COREG) 6.25 MG tablet Take 3.125 mg by mouth 2 (two) times daily with a meal.    Yes Historical Provider, MD  Cholecalciferol (VITAMIN D3) 5000 UNITS TABS Take 1 tablet by mouth daily.   Yes Historical Provider, MD  Fluticasone Furoate-Vilanterol (BREO ELLIPTA) 100-25 MCG/INH AEPB Inhale 1 puff into the lungs daily.    Yes Historical Provider, MD  furosemide (LASIX) 40 MG tablet Take 40 mg by mouth daily.   Yes Historical Provider, MD  Lactobacillus (PROBIOTIC ACIDOPHILUS PO) Take 60 mg by mouth daily.   Yes Historical Provider, MD  lisinopril (PRINIVIL,ZESTRIL) 10 MG tablet Take 5 mg by mouth daily. 01/31/15  Yes  Historical Provider, MD  meclizine (ANTIVERT) 25 MG tablet Take 25 mg by mouth 3 (three) times daily as needed for dizziness. Patient takes at least one daily. 11/25/13  Yes Historical Provider, MD  meloxicam (MOBIC) 15 MG tablet  03/19/16  Yes Historical Provider, MD  Multiple Vitamin (MULITIVITAMIN WITH MINERALS) TABS Take 1 tablet by mouth daily.   Yes Historical Provider, MD  Omega-3 Fatty Acids (FISH OIL) 1000 MG CAPS Take 1,000 mg by mouth 2 (two) times daily.   Yes Historical Provider, MD  omeprazole (PRILOSEC) 40 MG capsule Take 40 mg by mouth daily.   Yes Historical Provider, MD  potassium chloride SA (K-DUR,KLOR-CON) 20 MEQ tablet Take 40 mEq by mouth daily.    Yes Historical Provider, MD  promethazine (PHENERGAN) 25 MG tablet Take 25 mg by mouth every 8 (eight) hours as needed. For nausea   Yes Historical Provider, MD  Tiotropium Bromide Monohydrate (SPIRIVA RESPIMAT) 2.5 MCG/ACT AERS Inhale 2 puffs into the lungs daily.    Yes Historical Provider, MD  Vilazodone HCl (VIIBRYD) 40 MG TABS Take 40 mg by mouth daily.   Yes Historical Provider, MD  zolpidem (AMBIEN) 10 MG tablet Take 5 mg by mouth at bedtime as needed. For sleep   Yes Historical Provider, MD  topiramate (TOPAMAX) 100 MG tablet Take 1 tablet (100 mg total) by mouth 2 (two) times daily. 03/26/16   Kathrynn Ducking, MD    ROS:  Out of a complete 14 system review of symptoms, the patient complains only of the following symptoms, and all other reviewed systems are negative.  Vertigo Nausea Insomnia Memory loss, dizziness Confusion, decreased concentration, anxiety Walking problems  Blood pressure 98/68, pulse 78, height 5\' 4"  (1.626 m), weight 158 lb 8 oz (71.9 kg).  Physical Exam  General: The patient is alert and cooperative at the time of the examination.  Skin: No significant peripheral edema is noted.   Neurologic Exam  Mental status: The patient is alert and oriented x 3 at the time of the examination. The  patient has apparent normal recent and remote memory, with an apparently normal attention span and concentration ability.   Cranial nerves: Facial symmetry is present. Speech is normal, no aphasia or dysarthria is noted. Extraocular movements are full. Visual fields are full.  Motor: The patient has good strength in all 4 extremities.  Sensory examination: Soft touch sensation is symmetric on the face, arms, and legs.  Coordination: The patient has good finger-nose-finger and heel-to-shin bilaterally.  Gait and station: The patient has a normal gait. Tandem gait is unsteady. Romberg is negative. No drift is seen.  Reflexes: Deep tendon reflexes are symmetric.   Assessment/Plan:  1. Episodes of alteration of awareness  2. Migraine headache  3. Episodic vertigo  4. Cerebral aneurysm  The patient is having  difficulty with balance and falls secondary to episodes of alteration of awareness, this results in veering to the right with walking, and with a  subsequent fall. The patient has a "out of body experience" associated with these episodes. The episodes have occurred at least 6 times over the last 2 weeks. The patient has had EEG evaluation in the past that showed sharp transients emanating from the right temporal area. The events described above may represent seizures, but the patient remains on Topamax. We will increase the dose to 100 mg twice daily. The patient will undergo an EEG study. She has already been set up for MRI evaluation of the brain. She will follow-up in 4 months.  Jill Alexanders MD 03/26/2016 4:59 PM  [facility] [address] [place], [place] [address]  Phone [phone] Fax [phone]

## 2016-03-27 ENCOUNTER — Ambulatory Visit (HOSPITAL_COMMUNITY)
Admission: RE | Admit: 2016-03-27 | Discharge: 2016-03-27 | Disposition: A | Discharge status: Home / Self Care | Payer: Medicare Other | Source: Ambulatory Visit | Attending: Interventional Radiology | Admitting: Interventional Radiology

## 2016-03-27 ENCOUNTER — Encounter (HOSPITAL_COMMUNITY): Payer: Self-pay

## 2016-03-27 DIAGNOSIS — I729 Aneurysm of unspecified site: Secondary | ICD-10-CM

## 2016-03-27 DIAGNOSIS — I671 Cerebral aneurysm, nonruptured: Secondary | ICD-10-CM | POA: Insufficient documentation

## 2016-03-27 LAB — CREATININE, SERUM
Creatinine, Ser: 0.94 mg/dL (ref 0.44–1.00)
GFR calc non Af Amer: >60 mL/min (ref >60)

## 2016-03-27 MED ORDER — GADOBENATE DIMEGLUMINE 529 MG/ML IV SOLN
15.0000 mL | Freq: Once | INTRAVENOUS | Status: AC | PRN
  Administered 2016-03-27: 15 mL via INTRAVENOUS

## 2016-04-17 ENCOUNTER — Telehealth: Payer: Self-pay | Admitting: Neurology

## 2016-04-17 ENCOUNTER — Ambulatory Visit (INDEPENDENT_AMBULATORY_CARE_PROVIDER_SITE_OTHER): Payer: Medicare Other

## 2016-04-17 DIAGNOSIS — R4182 Altered mental status, unspecified: Secondary | ICD-10-CM

## 2016-04-17 DIAGNOSIS — R404 Transient alteration of awareness: Secondary | ICD-10-CM

## 2016-04-17 NOTE — Telephone Encounter (Signed)
I called patient. The patient had an EEG today that shows some right temporal irritability. The Topamax was increased, this has helped some of her events. If the events continue, I will add low-dose Keppra, the patient will call me regarding this.

## 2016-04-17 NOTE — Procedures (Signed)
     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 The patient has a history of migraine headaches, currently on Topamax. The patient has had episodes of feeling "out of body" and some episodes of vertigo. The patient is having episodes of imbalance associated with alteration of awareness, veering to the right. The patient is being evaluated for these events.  This is a routine EEG. No skull defects are noted. Medications include Xanax, aspirin, Lipitor, calcium supplementation, Coreg, vitamin D supplementation, fluticasone, Lasix, lisinopril, Antivert, fish oil, Prilosec, potassium supplementation, Phenergan, Spiriva, Ambien, Topamax, and Viibryd.  EEG classification: Dysrhythmia grade 2 right temporal  Description of the recording: The background rhythms of this recording consists of a fairly well modulated medium amplitude alpha rhythm of 9 Hz that is reactive to eye opening and closure. As the record progresses, photic stimulation is performed, this results in a bilateral and symmetric photic driving response. Hyperventilation was not performed. As the record continues, occasional episodes of theta slowing in the mid temporal area noted on the right, with occasional sharp transients seen. A rare sharp transient is seen in the left mid temporal area. At no time during the recording does there appear to be evidence of actual spike or spike-wave discharges. EKG monitor shows no evidence of cardiac rhythm abnormalities with a heart rate of 78.  Impression: This is an abnormal EEG recording secondary to slight episodic slowing in the right mid temporal area associated with sharp transients. This study suggests a right brain abnormality with a lowered seizure threshold. No electrographic seizures were recorded, however.

## 2016-04-18 HISTORY — PX: COLONOSCOPY: SHX174

## 2016-04-19 ENCOUNTER — Other Ambulatory Visit (HOSPITAL_COMMUNITY): Payer: Self-pay | Admitting: Interventional Radiology

## 2016-04-19 ENCOUNTER — Telehealth (HOSPITAL_COMMUNITY): Payer: Self-pay | Admitting: Radiology

## 2016-04-19 DIAGNOSIS — I671 Cerebral aneurysm, nonruptured: Secondary | ICD-10-CM

## 2016-04-19 NOTE — Telephone Encounter (Signed)
Called pt told her that Dev requests a consult to discuss her recent MRI. JM

## 2016-05-02 ENCOUNTER — Ambulatory Visit (HOSPITAL_COMMUNITY): Payer: Medicare Other

## 2016-05-10 ENCOUNTER — Ambulatory Visit (HOSPITAL_COMMUNITY)
Admission: RE | Admit: 2016-05-10 | Discharge: 2016-05-10 | Disposition: A | Discharge status: Home / Self Care | Payer: Medicare Other | Source: Ambulatory Visit | Attending: Interventional Radiology | Admitting: Interventional Radiology

## 2016-05-10 DIAGNOSIS — I671 Cerebral aneurysm, nonruptured: Secondary | ICD-10-CM

## 2016-05-10 HISTORY — PX: IR GENERIC HISTORICAL: IMG1180011

## 2016-05-13 ENCOUNTER — Encounter (HOSPITAL_COMMUNITY): Payer: Self-pay | Admitting: Interventional Radiology

## 2016-05-14 ENCOUNTER — Other Ambulatory Visit (HOSPITAL_COMMUNITY): Payer: Self-pay | Admitting: Interventional Radiology

## 2016-05-14 DIAGNOSIS — I729 Aneurysm of unspecified site: Secondary | ICD-10-CM

## 2016-05-21 ENCOUNTER — Other Ambulatory Visit: Payer: Self-pay | Admitting: Radiology

## 2016-05-23 ENCOUNTER — Encounter (HOSPITAL_COMMUNITY): Payer: Self-pay

## 2016-05-23 ENCOUNTER — Ambulatory Visit (HOSPITAL_COMMUNITY): Admission: RE | Admit: 2016-05-23 | Payer: Medicare Other | Source: Ambulatory Visit

## 2016-05-28 ENCOUNTER — Encounter (HOSPITAL_COMMUNITY): Payer: Self-pay

## 2016-05-28 ENCOUNTER — Other Ambulatory Visit (HOSPITAL_COMMUNITY): Payer: Self-pay | Admitting: Interventional Radiology

## 2016-05-28 ENCOUNTER — Ambulatory Visit (HOSPITAL_COMMUNITY)
Admission: RE | Admit: 2016-05-28 | Discharge: 2016-05-28 | Disposition: A | Discharge status: Home / Self Care | Payer: Medicare Other | Source: Ambulatory Visit | Attending: Interventional Radiology | Admitting: Interventional Radiology

## 2016-05-28 DIAGNOSIS — I671 Cerebral aneurysm, nonruptured: Secondary | ICD-10-CM | POA: Diagnosis not present

## 2016-05-28 DIAGNOSIS — R51 Headache: Secondary | ICD-10-CM | POA: Insufficient documentation

## 2016-05-28 DIAGNOSIS — I729 Aneurysm of unspecified site: Secondary | ICD-10-CM

## 2016-05-28 DIAGNOSIS — Z87891 Personal history of nicotine dependence: Secondary | ICD-10-CM | POA: Diagnosis not present

## 2016-05-28 DIAGNOSIS — Z8249 Family history of ischemic heart disease and other diseases of the circulatory system: Secondary | ICD-10-CM | POA: Diagnosis not present

## 2016-05-28 DIAGNOSIS — I10 Essential (primary) hypertension: Secondary | ICD-10-CM | POA: Insufficient documentation

## 2016-05-28 DIAGNOSIS — E785 Hyperlipidemia, unspecified: Secondary | ICD-10-CM | POA: Diagnosis not present

## 2016-05-28 DIAGNOSIS — Z8679 Personal history of other diseases of the circulatory system: Secondary | ICD-10-CM | POA: Insufficient documentation

## 2016-05-28 DIAGNOSIS — K219 Gastro-esophageal reflux disease without esophagitis: Secondary | ICD-10-CM | POA: Insufficient documentation

## 2016-05-28 DIAGNOSIS — Z882 Allergy status to sulfonamides status: Secondary | ICD-10-CM | POA: Diagnosis not present

## 2016-05-28 HISTORY — PX: IR GENERIC HISTORICAL: IMG1180011

## 2016-05-28 LAB — BASIC METABOLIC PANEL
Anion gap: 6 (ref 5–15)
BUN: 12 mg/dL (ref 6–20)
CALCIUM: 9 mg/dL (ref 8.9–10.3)
CO2: 25 mmol/L (ref 22–32)
CREATININE: 0.97 mg/dL (ref 0.44–1.00)
Chloride: 114 mmol/L — ABNORMAL HIGH (ref 101–111)
GFR calc non Af Amer: >60 mL/min (ref >60)
GLUCOSE: 90 mg/dL (ref 65–99)
Potassium: 3.1 mmol/L — ABNORMAL LOW (ref 3.5–5.1)
Sodium: 145 mmol/L (ref 135–145)

## 2016-05-28 LAB — APTT: aPTT: 25 seconds (ref 24–36)

## 2016-05-28 LAB — CBC
HEMATOCRIT: 39.1 % (ref 36.0–46.0)
HEMOGLOBIN: 12.6 g/dL (ref 12.0–15.0)
MCH: 30.6 pg (ref 26.0–34.0)
MCHC: 32.2 g/dL (ref 30.0–36.0)
MCV: 94.9 fL (ref 78.0–100.0)
PLATELETS: 274 10*3/uL (ref 150–400)
RBC: 4.12 MIL/uL (ref 3.87–5.11)
RDW: 13.2 % (ref 11.5–15.5)
WBC: 7.5 10*3/uL (ref 4.0–10.5)

## 2016-05-28 LAB — PROTIME-INR
INR: 1
Prothrombin Time: 13.2 seconds (ref 11.4–15.2)

## 2016-05-28 MED ORDER — LIDOCAINE HCL 1 % IJ SOLN
INTRAMUSCULAR | Status: AC | PRN
  Administered 2016-05-28: 15 mL

## 2016-05-28 MED ORDER — HEPARIN SODIUM (PORCINE) 1000 UNIT/ML IJ SOLN
INTRAMUSCULAR | Status: AC
  Filled 2016-05-28: qty 2

## 2016-05-28 MED ORDER — FENTANYL CITRATE (PF) 100 MCG/2ML IJ SOLN
INTRAMUSCULAR | Status: AC
  Filled 2016-05-28: qty 2

## 2016-05-28 MED ORDER — SODIUM CHLORIDE 0.9 % IV SOLN
INTRAVENOUS | Status: AC | PRN
  Administered 2016-05-28: 250 mL via INTRAVENOUS

## 2016-05-28 MED ORDER — IOPAMIDOL (ISOVUE-300) INJECTION 61%
INTRAVENOUS | Status: AC
  Administered 2016-05-28: 75 mL
  Filled 2016-05-28: qty 150

## 2016-05-28 MED ORDER — MIDAZOLAM HCL 2 MG/2ML IJ SOLN
INTRAMUSCULAR | Status: AC | PRN
  Administered 2016-05-28 (×2): 0.5 mg via INTRAVENOUS

## 2016-05-28 MED ORDER — LIDOCAINE HCL 1 % IJ SOLN
INTRAMUSCULAR | Status: AC
  Filled 2016-05-28: qty 20

## 2016-05-28 MED ORDER — SODIUM CHLORIDE 0.9 % IV SOLN: INTRAVENOUS | Status: AC

## 2016-05-28 MED ORDER — MIDAZOLAM HCL 2 MG/2ML IJ SOLN
INTRAMUSCULAR | Status: AC
  Filled 2016-05-28: qty 2

## 2016-05-28 MED ORDER — HEPARIN SODIUM (PORCINE) 1000 UNIT/ML IJ SOLN
INTRAMUSCULAR | Status: AC | PRN
  Administered 2016-05-28: 1000 [IU] via INTRAVENOUS

## 2016-05-28 MED ORDER — SODIUM CHLORIDE 0.9 % IV SOLN
Freq: Once | INTRAVENOUS | Status: AC
  Administered 2016-05-28: 10:00:00 via INTRAVENOUS

## 2016-05-28 MED ORDER — FENTANYL CITRATE (PF) 100 MCG/2ML IJ SOLN
INTRAMUSCULAR | Status: AC | PRN
  Administered 2016-05-28 (×2): 12.5 ug via INTRAVENOUS

## 2016-05-28 MED ORDER — IOPAMIDOL (ISOVUE-300) INJECTION 61%
INTRAVENOUS | Status: AC
  Administered 2016-05-28: 21 mL
  Filled 2016-05-28: qty 100

## 2016-05-28 NOTE — Progress Notes (Signed)
Assumed care of pt from Debbie Hedge, RN.  Assessment documented. 

## 2016-05-28 NOTE — Discharge Instructions (Signed)
Cerebral Angiogram, Care After °Refer to this sheet in the next few weeks. These instructions provide you with information on caring for yourself after your procedure. Your health care provider may also give you more specific instructions. Your treatment has been planned according to current medical practices, but problems sometimes occur. Call your health care provider if you have any problems or questions after your procedure. °WHAT TO EXPECT AFTER THE PROCEDURE °After your procedure, it is typical to have the following: °· Bruising at the catheter insertion site that usually fades within 1-2 weeks. °· Blood collecting in the tissue (hematoma) that may be painful to the touch. It should usually decrease in size and tenderness within 1-2 weeks. °· A mild headache. °HOME CARE INSTRUCTIONS °· Take medicines only as directed by your health care provider. °· You may shower 24-48 hours after the procedure or as directed by your health care provider. Remove the bandage (dressing) and gently wash the site with plain soap and water. Pat the area dry with a clean towel. Do not rub the site, because this may cause bleeding. °· Do not take baths, swim, or use a hot tub until your health care provider approves. °· Check your insertion site every day for redness, swelling, or drainage. °· Do not apply powder or lotion to the site. °· Do not lift over 10 lb (4.5 kg) for 5 days after your procedure or as directed by your health care provider. °· Ask your health care provider when it is okay to: °¨ Return to work or school. °¨ Resume usual physical activities or sports. °¨ Resume sexual activity. °· Do not drive home if you are discharged the same day as the procedure. Have someone else drive you. °· You may drive 24 hours after the procedure unless otherwise instructed by your health care provider. °· Do not operate machinery or power tools for 24 hours after the procedure or as directed by your health care provider. °· If your  procedure was done as an outpatient procedure, which means that you went home the same day as your procedure, a responsible adult should be with you for the first 24 hours after you arrive home. °· Keep all follow-up visits as directed by your health care provider. This is important. °SEEK MEDICAL CARE IF: °· You have a fever. °· You have chills. °· You have increased bleeding from the catheter insertion site. Hold pressure on the site. °SEEK IMMEDIATE MEDICAL CARE IF: °· You have vision changes or loss of vision. °· You have numbness or weakness on one side of your body. °· You have difficulty talking, or you have slurred speech or cannot speak (aphasia). °· You feel confused or have difficulty remembering. °· You have unusual pain at the catheter insertion site. °· You have redness, warmth, or swelling at the catheter insertion site. °· You have drainage (other than a small amount of blood on the dressing) from the catheter insertion site. °· The catheter insertion site is bleeding, and the bleeding does not stop after 30 minutes of holding steady pressure on the site. °These symptoms may represent a serious problem that is an emergency. Do not wait to see if the symptoms will go away. Get medical help right away. Call your local emergency services (911 in [place] Do not drive yourself to the hospital. °  °This information is not intended to replace advice given to you by your health care provider. Make sure you discuss any questions you have with your   health care provider. °  °Document Released: 12/27/2013 Document Revised: 05/31/2014 Document Reviewed: 12/27/2013 °Elsevier Interactive Patient Education ©2016 [employer] ° °

## 2016-05-28 NOTE — H&P (Signed)
Chief Complaint: new (R) ICA opthalmic aneurysm  Supervising Physician: Luanne Bras  Patient Status:  Out-pt  HPI: Amy Glover is an 65 y.o. female who is followed by Dr. Estanislado Pandy for a history of an aneurysm.  She has every other year MRI/As and Dx angiograms to follow this.  He was unable to treat this back in 2011 and sent her to [facility] to have this clipped surgically.  She has done well since then, but does have intermittent HAs.  She just had a surveillance MRI/A a couple of weeks ago which revealed a new (R) ICA opthalmic aneurysm.  He would like to proceed with a dx angiogram to further evaluate this area.  Past Medical History:  Past Medical History:  Diagnosis Date  . Abnormality of gait 08/08/2015  . Brain aneurysm    right ACOM  . Dizziness and giddiness 12/07/2013  . Dyslipidemia   . GERD (gastroesophageal reflux disease)   . Hypertension   . Migraine without aura, without mention of intractable migraine without mention of status migrainosus 12/07/2013  . Subclavian steal syndrome     Past Surgical History:  Past Surgical History:  Procedure Laterality Date  . BASIL CELL REMOVAL  2015   NOSE  . BRAIN SURGERY  04/20011   right ACOM aneurysm clip  . CESAREAN SECTION  1984  . IR GENERIC HISTORICAL  05/10/2016   IR RADIOLOGIST EVAL & MGMT 05/10/2016 [facility]  . SHOULDER SURGERY  2008  . STENT IN NECK  2009  . TONSILLECTOMY AND ADENOIDECTOMY  1961  . TUBAL LIGATION  1987  . WRIST SURGERY Right 2009    Family History:  Family History  Problem Relation Age of Onset  . Heart disease Mother   . Hypertension Father   . Cancer Father   . Multiple sclerosis Sister   . Seizures Sister     Social History:  reports that she has quit smoking. She has never used smokeless tobacco. She reports that she drinks alcohol. She reports that she does not use drugs.  Allergies:  Allergies  Allergen Reactions  . Sulfa Antibiotics Hives and Nausea And Vomiting      Medications: Medications reviewed in Epic  Please HPI for pertinent positives, otherwise complete 10 system ROS negative.  Mallampati Score: MD Evaluation Airway: WNL Heart: WNL Abdomen: WNL Chest/ Lungs: WNL ASA  Classification: 2 Mallampati/Airway Score: One  Physical Exam: BP 117/67   Pulse 67   Temp 98.5 F (36.9 C)   Resp 18   Ht _0  (1.626 m)   Wt 166 lb (75.3 kg)   SpO2 97%   BMI 28.49 kg/m  Body mass index is 28.49 kg/m. General: pleasant, WD, WN white female who is laying in bed in NAD HEENT: head is normocephalic, atraumatic.  Sclera are noninjected.  PERRL.  Ears and nose without any masses or lesions.  Mouth is pink and moist Heart: regular, rate, and rhythm.  Normal s1,s2. No obvious murmurs, gallops, or rubs noted.  Palpable radial pulses bilaterally Lungs: CTAB, no wheezes, rhonchi, or rales noted.  Respiratory effort nonlabored Abd: soft, NT, ND, +BS, no masses, hernias, or organomegaly Psych: A&Ox3 with an appropriate affect.   Labs: Results for orders placed or performed during the hospital encounter of 05/28/16 (from the past 48 hour(s))  APTT     Status: None   Collection Time: 05/28/16  9:45 AM  Result Value Ref Range   aPTT 25 24 - 36 seconds  Basic  metabolic panel     Status: Abnormal   Collection Time: 05/28/16  9:45 AM  Result Value Ref Range   Sodium 145 135 - 145 mmol/L   Potassium 3.1 (L) 3.5 - 5.1 mmol/L   Chloride 114 (H) 101 - 111 mmol/L   CO2 25 22 - 32 mmol/L   Glucose, Bld 90 65 - 99 mg/dL   BUN 12 6 - 20 mg/dL   Creatinine, Ser 0.97 0.44 - 1.00 mg/dL   Calcium 9.0 8.9 - 10.3 mg/dL   GFR calc non Af Amer >60 >60 mL/min   GFR calc Af Amer >60 >60 mL/min    Comment: (NOTE) The eGFR has been calculated using the CKD EPI equation. This calculation has not been validated in all clinical situations. eGFR's persistently <60 mL/min signify possible Chronic Kidney Disease.    Anion gap 6 5 - 15  CBC     Status: None    Collection Time: 05/28/16  9:45 AM  Result Value Ref Range   WBC 7.5 4.0 - 10.5 K/uL   RBC 4.12 3.87 - 5.11 MIL/uL   Hemoglobin 12.6 12.0 - 15.0 g/dL   HCT 39.1 36.0 - 46.0 %   MCV 94.9 78.0 - 100.0 fL   MCH 30.6 26.0 - 34.0 pg   MCHC 32.2 30.0 - 36.0 g/dL   RDW 13.2 11.5 - 15.5 %   Platelets 274 150 - 400 K/uL  Protime-INR     Status: None   Collection Time: 05/28/16  9:45 AM  Result Value Ref Range   Prothrombin Time 13.2 11.4 - 15.2 seconds   INR 1.00     Imaging: No results found.  Assessment/Plan 1. New (R) ICA opthalmic aneurysm -labs and vitals reviewed -we will proceed with dx cerebral angiogram today to further assess this new finding -Risks and Benefits discussed with the patient including, but not limited to bleeding, infection, vascular injury or contrast induced renal failure. All of the patient's questions were answered, patient is agreeable to proceed. Consent signed and in chart.  Thank you for this interesting consult.  I greatly enjoyed meeting JANEVA PEASTER and look forward to participating in their care.  A copy of this report was sent to the requesting provider on this date.  Electronically Signed: Henreitta Cea 05/28/2016, 11:58 AM   I spent a total of    25 Minutes in face to face in clinical consultation, greater than 50% of which was counseling/coordinating care for (R) ICA opthalmic aneurysm

## 2016-05-28 NOTE — Sedation Documentation (Signed)
V-pad to right groin

## 2016-05-28 NOTE — Sedation Documentation (Signed)
ETCO2 removed per Dr. Deveshwar 

## 2016-05-28 NOTE — Procedures (Signed)
S/p 4 vessel cerebral arteriogram RT CFA approach. Findings. 1.Approx 2.74mmx 2.5 mm LT ICA  periophthalmic aneurysm . 2.Previously surgically placed clips in ACOM region without evidence of recurrent or residual aneurysm.

## 2016-05-29 ENCOUNTER — Encounter (HOSPITAL_COMMUNITY): Payer: Self-pay | Admitting: Interventional Radiology

## 2016-05-29 ENCOUNTER — Other Ambulatory Visit (HOSPITAL_COMMUNITY): Payer: Self-pay | Admitting: Interventional Radiology

## 2016-05-29 DIAGNOSIS — I729 Aneurysm of unspecified site: Secondary | ICD-10-CM

## 2016-05-30 ENCOUNTER — Telehealth (HOSPITAL_COMMUNITY): Payer: Self-pay

## 2016-05-30 ENCOUNTER — Other Ambulatory Visit (HOSPITAL_COMMUNITY): Payer: Self-pay | Admitting: Interventional Radiology

## 2016-05-30 DIAGNOSIS — I729 Aneurysm of unspecified site: Secondary | ICD-10-CM

## 2016-05-30 NOTE — Telephone Encounter (Signed)
Pt's husband called on 05/29/16 to inform Dr. Estanislado Pandy that pt's right eye was very red like a vessel had burst. They were not sure if this was normal since she had just had the angiogram done on 05/28/16. I returned phone call to schedule f/u and to inform husband that I have given message to Dr. Estanislado Pandy. I will get back with them as soon as Dr. Estanislado Pandy returns. Pt agreed with this plan. AW

## 2016-05-30 NOTE — Telephone Encounter (Signed)
Called pt's husband back to inform him that Dr. Estanislado Pandy wants pt to keep the right eye moist and try not to rub it. The redness should go away. If it does not please give [place] a call. Husband agreed to this plan. AW

## 2016-06-04 ENCOUNTER — Ambulatory Visit: Payer: Medicare Other | Admitting: Cardiology

## 2016-06-06 ENCOUNTER — Telehealth (HOSPITAL_COMMUNITY): Payer: Self-pay

## 2016-06-06 NOTE — Telephone Encounter (Signed)
Called to reschedule consult, left message for pt's husband to return call. AW

## 2016-06-10 ENCOUNTER — Ambulatory Visit: Payer: Medicare Other | Admitting: Cardiology

## 2016-06-11 ENCOUNTER — Ambulatory Visit (HOSPITAL_COMMUNITY): Payer: Medicare Other

## 2016-06-12 ENCOUNTER — Ambulatory Visit (HOSPITAL_COMMUNITY)
Admission: RE | Admit: 2016-06-12 | Discharge: 2016-06-12 | Disposition: A | Discharge status: Home / Self Care | Payer: Medicare Other | Source: Ambulatory Visit | Attending: Interventional Radiology | Admitting: Interventional Radiology

## 2016-06-12 DIAGNOSIS — I729 Aneurysm of unspecified site: Secondary | ICD-10-CM

## 2016-06-12 HISTORY — PX: IR GENERIC HISTORICAL: IMG1180011

## 2016-06-13 ENCOUNTER — Encounter (HOSPITAL_COMMUNITY): Payer: Self-pay | Admitting: Interventional Radiology

## 2016-08-12 ENCOUNTER — Ambulatory Visit: Payer: Medicare Other | Admitting: Cardiology

## 2016-08-13 ENCOUNTER — Ambulatory Visit (INDEPENDENT_AMBULATORY_CARE_PROVIDER_SITE_OTHER): Payer: Medicare Other | Admitting: Cardiology

## 2016-08-13 VITALS — BP 108/54 | HR 76 | Ht 64.0 in | Wt 161.0 lb

## 2016-08-13 DIAGNOSIS — E785 Hyperlipidemia, unspecified: Secondary | ICD-10-CM

## 2016-08-13 DIAGNOSIS — I10 Essential (primary) hypertension: Secondary | ICD-10-CM

## 2016-08-13 DIAGNOSIS — R296 Repeated falls: Secondary | ICD-10-CM | POA: Diagnosis not present

## 2016-08-13 DIAGNOSIS — I428 Other cardiomyopathies: Secondary | ICD-10-CM | POA: Diagnosis not present
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
## 2016-08-13 NOTE — Patient Instructions (Signed)

## 2016-08-22 ENCOUNTER — Encounter: Payer: Self-pay | Admitting: Neurology

## 2016-08-22 ENCOUNTER — Ambulatory Visit (INDEPENDENT_AMBULATORY_CARE_PROVIDER_SITE_OTHER): Payer: Medicare Other | Admitting: Neurology

## 2016-08-22 VITALS — BP 98/68 | HR 81 | Ht 64.0 in | Wt 159.2 lb

## 2016-08-22 DIAGNOSIS — R42 Dizziness and giddiness: Secondary | ICD-10-CM

## 2016-08-22 DIAGNOSIS — R296 Repeated falls: Secondary | ICD-10-CM | POA: Diagnosis not present

## 2016-08-22 DIAGNOSIS — G43019 Migraine without aura, intractable, without status migrainosus: Secondary | ICD-10-CM

## 2016-08-22 DIAGNOSIS — R269 Unspecified abnormalities of gait and mobility: Secondary | ICD-10-CM

## 2016-08-22 NOTE — Progress Notes (Signed)
Reason for visit: Migraine headache  POLET ALLGOOD is an 65 y.o. female  History of present illness:  Ms. Coronel is a 64 year old right-handed white female with a history of migraine headaches. The patient is having on average about one headache a week, she is on Topamax which seems to have helped this. The patient was having episodes of feeling "out of body", the increase in the Topamax has helped this, she has had abnormal EEG studies previously with spikes in the right temporal area. The patient continues to have episodes of vertigo that may come on 2 or 3 times a week and last for about 45 minutes. The patient will remain still, she feels nausea with this and will take Phenergan and meclizine for these events. The patient denies that these events of vertigo are associated with her migraine headache. The patient has had a recent fall that was unassociated with the vertigo, she was trying to walk up a flight of stairs quickly and fell. The patient has a brace on her right knee because of this, she is walking with a walker currently. The patient returns to the office for an evaluation. The patient has had vestibular rehabilitation on 2 occasions without benefit with the vertigo.  Past Medical History:  Diagnosis Date  . Abnormality of gait 08/08/2015  . Brain aneurysm    right ACOM  . Dizziness and giddiness 12/07/2013  . Dyslipidemia   . Fall   . GERD (gastroesophageal reflux disease)   . Hypertension   . Migraine without aura, without mention of intractable migraine without mention of status migrainosus 12/07/2013  . Subclavian steal syndrome     Past Surgical History:  Procedure Laterality Date  . BASIL CELL REMOVAL  2015   NOSE  . BRAIN SURGERY  04/20011   right ACOM aneurysm clip  . CESAREAN SECTION  1984  . IR GENERIC HISTORICAL  05/10/2016   IR RADIOLOGIST EVAL & MGMT 05/10/2016 [facility]  . IR GENERIC HISTORICAL  05/28/2016   IR ANGIO INTRA EXTRACRAN SEL COM CAROTID  INNOMINATE BILAT MOD SED 05/28/2016 Luanne Bras, MD [facility]  . IR GENERIC HISTORICAL  05/28/2016   IR ANGIO VERTEBRAL SEL SUBCLAVIAN INNOMINATE BILAT MOD SED 05/28/2016 Luanne Bras, MD [facility]  . IR GENERIC HISTORICAL  05/28/2016   IR 3D INDEPENDENT WKST 05/28/2016 Luanne Bras, MD [facility]  . IR GENERIC HISTORICAL  06/12/2016   IR RADIOLOGIST EVAL & MGMT 06/12/2016 [facility]  . SHOULDER SURGERY  2008  . STENT IN NECK  2009  . TONSILLECTOMY AND ADENOIDECTOMY  1961  . TUBAL LIGATION  1987  . WRIST SURGERY Right 2009    Family History  Problem Relation Age of Onset  . Heart disease Mother   . Hypertension Father   . Cancer Father   . Multiple sclerosis Sister   . Seizures Sister     Social history:  reports that she has quit smoking. She has never used smokeless tobacco. She reports that she drinks alcohol. She reports that she does not use drugs.    Allergies  Allergen Reactions  . Sulfa Antibiotics Hives and Nausea And Vomiting    Medications:  Prior to Admission medications   Medication Sig Start Date End Date Taking? Authorizing Provider  ALPRAZolam (XANAX) 0.25 MG tablet Take 0.25-0.5 mg by mouth 2 (two) times daily as needed for anxiety (with meclizine for anxiety and dizziness).   Yes Historical Provider, MD  aspirin EC 81 MG tablet  Take 162 mg by mouth every evening.   Yes Historical Provider, MD  atorvastatin (LIPITOR) 40 MG tablet Take 40 mg by mouth daily.  11/19/13  Yes Historical Provider, MD  b complex vitamins tablet Take 1 tablet by mouth daily.   Yes Historical Provider, MD  Biotin (BIOTIN MAXIMUM STRENGTH) 10 MG TABS Take 1 tablet by mouth daily.   Yes Historical Provider, MD  Calcium Carb-Cholecalciferol (CALCIUM 600+D3 PO) Take 2 tablets by mouth daily.   Yes Historical Provider, MD  carvedilol (COREG) 6.25 MG tablet Take 3.125 mg by mouth 2 (two) times daily with a meal.    Yes Historical Provider, MD  Cholecalciferol  (VITAMIN D3) 5000 UNITS TABS Take 1 tablet by mouth daily.   Yes Historical Provider, MD  Fluticasone Furoate-Vilanterol (BREO ELLIPTA) 100-25 MCG/INH AEPB Inhale 1 puff into the lungs daily.    Yes Historical Provider, MD  furosemide (LASIX) 40 MG tablet Take 40 mg by mouth daily.   Yes Historical Provider, MD  Lactobacillus (PROBIOTIC ACIDOPHILUS PO) Take 60 mg by mouth daily.   Yes Historical Provider, MD  lisinopril (PRINIVIL,ZESTRIL) 10 MG tablet Take 5 mg by mouth daily. 01/31/15  Yes Historical Provider, MD  meclizine (ANTIVERT) 25 MG tablet Take 25 mg by mouth 3 (three) times daily as needed for dizziness. Patient takes at least one daily. 11/25/13  Yes Historical Provider, MD  meloxicam (MOBIC) 15 MG tablet Take 15 mg by mouth daily.  03/19/16  Yes Historical Provider, MD  Multiple Vitamin (MULITIVITAMIN WITH MINERALS) TABS Take 1 tablet by mouth daily.   Yes Historical Provider, MD  Omega-3 Fatty Acids (FISH OIL) 1000 MG CAPS Take 1,000 mg by mouth 2 (two) times daily.   Yes Historical Provider, MD  omeprazole (PRILOSEC) 40 MG capsule Take 40 mg by mouth daily.   Yes Historical Provider, MD  potassium chloride SA (K-DUR,KLOR-CON) 20 MEQ tablet Take 40 mEq by mouth daily.    Yes Historical Provider, MD  promethazine (PHENERGAN) 25 MG tablet Take 25 mg by mouth every 8 (eight) hours as needed. For nausea   Yes Historical Provider, MD  Tiotropium Bromide Monohydrate (SPIRIVA RESPIMAT) 2.5 MCG/ACT AERS Inhale 2 puffs into the lungs daily.    Yes Historical Provider, MD  topiramate (TOPAMAX) 100 MG tablet Take 1 tablet (100 mg total) by mouth 2 (two) times daily. 03/26/16  Yes Kathrynn Ducking, MD  Vilazodone HCl (VIIBRYD) 40 MG TABS Take 40 mg by mouth daily.   Yes Historical Provider, MD  zolpidem (AMBIEN) 10 MG tablet Take 5 mg by mouth at bedtime as needed for sleep. For sleep    Yes Historical Provider, MD    ROS:  Out of a complete 14 system review of symptoms, the patient complains only of  the following symptoms, and all other reviewed systems are negative.  Ringing in the ears Insomnia, snoring Knee discomfort, walking difficulty, neck stiffness Dizziness Depression, anxiety  Blood pressure 98/68, pulse 81, height 5\' 4"  (1.626 m), weight 159 lb 4 oz (72.2 kg).  Physical Exam  General: The patient is alert and cooperative at the time of the examination.  Skin: No significant peripheral edema is noted.   Neurologic Exam  Mental status: The patient is alert and oriented x 3 at the time of the examination. The patient has apparent normal recent and remote memory, with an apparently normal attention span and concentration ability.   Cranial nerves: Facial symmetry is present. Speech is normal, no aphasia or dysarthria  is noted. Extraocular movements are full. Visual fields are full.  Motor: The patient has good strength in all 4 extremities.  Sensory examination: Soft touch sensation is symmetric on the face, arms, and legs.  Coordination: The patient has good finger-nose-finger and heel-to-shin bilaterally.  Gait and station: The patient is currently walking with a walker. Tandem gait was not attempted. Romberg is negative. No drift is seen.  Reflexes: Deep tendon reflexes are symmetric.   Assessment/Plan:  1. Migraine headache  2. Abnormal EEG, possible seizures  3. Episodic vertigo  The patient will go up on the meclizine taking the medication on a scheduled basis, one half of a 25 mg tablet 3 times daily. She will continue the Topamax at the current dose, she will follow-up in about 5 months.  Jill Alexanders MD 08/22/2016 10:33 AM  [facility] [address] [place], [place] [address]  Phone [phone] Fax [phone]

## 2016-12-03 ENCOUNTER — Other Ambulatory Visit (HOSPITAL_COMMUNITY): Payer: Self-pay | Admitting: Interventional Radiology

## 2016-12-03 ENCOUNTER — Telehealth (HOSPITAL_COMMUNITY): Payer: Self-pay

## 2016-12-03 DIAGNOSIS — I729 Aneurysm of unspecified site: Secondary | ICD-10-CM

## 2016-12-03 NOTE — Telephone Encounter (Signed)
Called to schedule, left message for pt to call back. AW

## 2016-12-19 ENCOUNTER — Ambulatory Visit (HOSPITAL_COMMUNITY)
Admission: RE | Admit: 2016-12-19 | Discharge: 2016-12-19 | Disposition: A | Discharge status: Home / Self Care | Payer: Medicare Other | Source: Ambulatory Visit | Attending: Interventional Radiology | Admitting: Interventional Radiology

## 2016-12-19 ENCOUNTER — Ambulatory Visit (HOSPITAL_COMMUNITY): Payer: Medicare Other

## 2016-12-19 ENCOUNTER — Encounter (HOSPITAL_COMMUNITY): Payer: Self-pay

## 2016-12-19 DIAGNOSIS — I720 Aneurysm of carotid artery: Secondary | ICD-10-CM | POA: Diagnosis not present

## 2016-12-19 DIAGNOSIS — I729 Aneurysm of unspecified site: Secondary | ICD-10-CM | POA: Diagnosis present

## 2016-12-19 LAB — CREATININE, SERUM
CREATININE: 1.09 mg/dL — AB (ref 0.44–1.00)
GFR calc Af Amer: >60 mL/min (ref >60)
GFR calc non Af Amer: 52 mL/min — ABNORMAL LOW (ref >60)

## 2016-12-19 MED ORDER — GADOBENATE DIMEGLUMINE 529 MG/ML IV SOLN
20.0000 mL | Freq: Once | INTRAVENOUS | Status: AC
  Administered 2016-12-19: 16 mL via INTRAVENOUS

## 2016-12-30 ENCOUNTER — Telehealth (HOSPITAL_COMMUNITY): Payer: Self-pay

## 2016-12-30 NOTE — Telephone Encounter (Signed)
Called, left message for pt to return call. AW 

## 2017-01-02 ENCOUNTER — Other Ambulatory Visit (HOSPITAL_COMMUNITY): Payer: Self-pay | Admitting: Interventional Radiology

## 2017-01-02 DIAGNOSIS — I729 Aneurysm of unspecified site: Secondary | ICD-10-CM

## 2017-01-09 ENCOUNTER — Ambulatory Visit (HOSPITAL_COMMUNITY)
Admission: RE | Admit: 2017-01-09 | Discharge: 2017-01-09 | Disposition: A | Discharge status: Home / Self Care | Payer: Medicare Other | Source: Ambulatory Visit | Attending: Interventional Radiology | Admitting: Interventional Radiology

## 2017-01-09 DIAGNOSIS — I729 Aneurysm of unspecified site: Secondary | ICD-10-CM

## 2017-01-09 HISTORY — PX: IR RADIOLOGIST EVAL & MGMT: IMG5224

## 2017-01-10 ENCOUNTER — Encounter (HOSPITAL_COMMUNITY): Payer: Self-pay | Admitting: Interventional Radiology

## 2017-01-23 ENCOUNTER — Ambulatory Visit (INDEPENDENT_AMBULATORY_CARE_PROVIDER_SITE_OTHER): Payer: Medicare Other | Admitting: Neurology

## 2017-01-23 ENCOUNTER — Encounter: Payer: Self-pay | Admitting: Neurology

## 2017-01-23 VITALS — BP 90/60 | HR 73 | Ht 64.0 in | Wt 164.5 lb

## 2017-01-23 DIAGNOSIS — R42 Dizziness and giddiness: Secondary | ICD-10-CM | POA: Diagnosis not present

## 2017-01-23 DIAGNOSIS — G8929 Other chronic pain: Secondary | ICD-10-CM

## 2017-01-23 DIAGNOSIS — M5441 Lumbago with sciatica, right side: Secondary | ICD-10-CM

## 2017-01-23 DIAGNOSIS — R269 Unspecified abnormalities of gait and mobility: Secondary | ICD-10-CM | POA: Diagnosis not present

## 2017-01-23 DIAGNOSIS — G43019 Migraine without aura, intractable, without status migrainosus: Secondary | ICD-10-CM

## 2017-01-23 NOTE — Progress Notes (Signed)
Reason for visit: Headaches, dizziness  Amy Glover is an 66 y.o. female  History of present illness:  Amy Glover is a 66 year old right-handed white female with a history of migraine headaches that improved with the use of Topamax currently at 100 mg twice daily. The patient is having 1 or 2 headaches a month. The patient is having ongoing episodes of vertigo that have improved but still occur 2 or 3 times a week. The patient is taking meclizine on a daily basis for this. The patient has had chronic issues with low back pain, she has been followed through a pain center but her doctor is moving to another city and she wants a referral to a doctor here in [place]. The patient gets occasional nerve blocks in the back for her discomfort. She does report some sciatica pain down the right leg. She will be undergoing surgery for a right total knee replacement in the near future. 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 Estanislado Pandy. The patient returns to this office for an evaluation.   Past Medical History:  Diagnosis Date  . Abnormality of gait 08/08/2015  . Brain aneurysm    right ACOM  . Dizziness and giddiness 12/07/2013  . Dyslipidemia   . Fall   . GERD (gastroesophageal reflux disease)   . Hypertension   . Migraine without aura, without mention of intractable migraine without mention of status migrainosus 12/07/2013  . Subclavian steal syndrome     Past Surgical History:  Procedure Laterality Date  . BASIL CELL REMOVAL  2015   NOSE  . BRAIN SURGERY  04/20011   right ACOM aneurysm clip  . CESAREAN SECTION  1984  . IR GENERIC HISTORICAL  05/10/2016   IR RADIOLOGIST EVAL & MGMT 05/10/2016 [facility]  . IR GENERIC HISTORICAL  05/28/2016   IR ANGIO INTRA EXTRACRAN SEL COM CAROTID INNOMINATE BILAT MOD SED 05/28/2016 Luanne Bras, MD [facility]  . IR GENERIC HISTORICAL  05/28/2016   IR ANGIO VERTEBRAL SEL  SUBCLAVIAN INNOMINATE BILAT MOD SED 05/28/2016 Luanne Bras, MD [facility]  . IR GENERIC HISTORICAL  05/28/2016   IR 3D INDEPENDENT WKST 05/28/2016 Luanne Bras, MD [facility]  . IR GENERIC HISTORICAL  06/12/2016   IR RADIOLOGIST EVAL & MGMT 06/12/2016 [facility]  . IR RADIOLOGIST EVAL & MGMT  01/09/2017  . SHOULDER SURGERY  2008  . STENT IN NECK  2009  . TONSILLECTOMY AND ADENOIDECTOMY  1961  . TUBAL LIGATION  1987  . WRIST SURGERY Right 2009    Family History  Problem Relation Age of Onset  . Heart disease Mother   . Hypertension Father   . Cancer Father   . Multiple sclerosis Sister   . Seizures Sister     Social history:  reports that she has quit smoking. She has never used smokeless tobacco. She reports that she drinks alcohol. She reports that she does not use drugs.    Allergies  Allergen Reactions  . Sulfa Antibiotics Hives and Nausea And Vomiting    Medications:  Prior to Admission medications   Medication Sig Start Date End Date Taking? Authorizing Provider  ALPRAZolam (XANAX) 0.25 MG tablet Take 0.25-0.5 mg by mouth 2 (two) times daily as needed for anxiety (with meclizine for anxiety and dizziness).   Yes [provider]  aspirin EC 81 MG tablet Take 162 mg by mouth every evening.   Yes  [provider]  atorvastatin (LIPITOR) 40 MG tablet Take 40 mg by mouth daily.  11/19/13  Yes [provider]  b complex vitamins tablet Take 1 tablet by mouth daily.   Yes [provider]  budesonide-formoterol (SYMBICORT) 160-4.5 MCG/ACT inhaler Inhale 2 puffs into the lungs 2 (two) times daily.   Yes [provider]  Calcium Carb-Cholecalciferol (CALCIUM 600+D3 PO) Take 2 tablets by mouth daily.   Yes [provider]  carvedilol (COREG) 6.25 MG tablet Take 3.125 mg by mouth 2 (two) times daily with a meal.    Yes [provider]  Cholecalciferol (VITAMIN D3) 5000 UNITS TABS Take 1 tablet by mouth  daily.   Yes [provider]  estradiol (ESTRACE) 0.1 MG/GM vaginal cream Place 1 Applicatorful vaginally at bedtime.   Yes [provider]  fluticasone (FLONASE) 50 MCG/ACT nasal spray Place 1 spray into both nostrils daily.   Yes [provider]  furosemide (LASIX) 40 MG tablet Take 40 mg by mouth daily.   Yes [provider]  ibuprofen (ADVIL,MOTRIN) 400 MG tablet Take 400 mg by mouth every 6 (six) hours as needed.   Yes [provider]  lisinopril (PRINIVIL,ZESTRIL) 10 MG tablet Take 5 mg by mouth daily. 01/31/15  Yes [provider]  meclizine (ANTIVERT) 25 MG tablet Take 25 mg by mouth 3 (three) times daily as needed for dizziness. Patient takes at least one daily. 11/25/13  Yes [provider]  meloxicam (MOBIC) 15 MG tablet Take 15 mg by mouth daily.  03/19/16  Yes [provider]  Nutritional Supplements (JUICE PLUS FIBRE PO) Take 1 Dose by mouth 2 (two) times daily.   Yes [provider]  Omega-3 Fatty Acids (FISH OIL) 1000 MG CAPS Take 1,000 mg by mouth 2 (two) times daily.   Yes [provider]  omeprazole (PRILOSEC) 40 MG capsule Take 40 mg by mouth daily.   Yes [provider]  polycarbophil (FIBERCON) 625 MG tablet Take 625 mg by mouth every other day.   Yes [provider]  potassium chloride SA (K-DUR,KLOR-CON) 20 MEQ tablet Take 40 mEq by mouth daily.    Yes [provider]  promethazine (PHENERGAN) 25 MG tablet Take 25 mg by mouth every 8 (eight) hours as needed. For nausea   Yes [provider]  Tiotropium Bromide Monohydrate (SPIRIVA RESPIMAT) 2.5 MCG/ACT AERS Inhale 2 puffs into the lungs daily.    Yes [provider]  topiramate (TOPAMAX) 100 MG tablet Take 1 tablet (100 mg total) by mouth 2 (two) times daily. 03/26/16  Yes Kathrynn Ducking, MD  Vilazodone HCl (VIIBRYD) 40 MG TABS Take 40 mg by mouth daily.   Yes [provider]  zolpidem  (AMBIEN) 10 MG tablet Take 5 mg by mouth at bedtime as needed for sleep. For sleep    Yes [provider]    ROS:  Out of a complete 14 system review of symptoms, the patient complains only of the following symptoms, and all other reviewed systems are negative.  Ringing in the ears, runny nose Right knee pain, back pain Dizziness, headache Depression  Blood pressure 90/60, pulse 73, height 5\' 4"  (1.626 m), weight 164 lb 8 oz (74.6 kg), SpO2 98 %.  Physical Exam  General: The patient is alert and cooperative at the time of the examination. The patient is moderately obese.  Skin: No significant peripheral edema is noted.   Neurologic Exam  Mental status: The patient  is alert and oriented x 3 at the time of the examination. The patient has apparent normal recent and remote memory, with an apparently normal attention span and concentration ability.   Cranial nerves: Facial symmetry is present. Speech is normal, no aphasia or dysarthria is noted. Extraocular movements are full. Visual fields are full.  Motor: The patient has good strength in all 4 extremities.  Sensory examination: Soft touch sensation is symmetric on the face, arms, and legs.  Coordination: The patient has good finger-nose-finger and heel-to-shin bilaterally.  Gait and station: The patient has a normal gait. Tandem gait is unsteady. Romberg is negative. No drift is seen.  Reflexes: Deep tendon reflexes are symmetric.    MRI lumbar 12/11/16:  IMPRESSION: L3/L4 degenerative changes with mild-to-moderate central canal stenosis and moderate bilateral foraminal stenosis secondary to marked facet degenerative changes and a small disc bulge.  L2/L3 disc bulge with mild central canal mild bilateral foraminal stenosis. Moderate facet degenerative changes.  L4/L5 mild bilateral foraminal stenosis with extensive facet degenerative changes.  Mild anterolisthesis L3 on L4 and L4 on L5 secondary to facet  degenerative changes.   Assessment/Plan:  1. Migraine headache  2. Episodic vertigo  3. Chronic low back pain  The patient will be given a referral to a pain center, Dr. Nicholaus Bloom. The patient is doing well with the Topamax, this will be continued. The patient will follow-up in about 6 months, sooner if needed.  Jill Alexanders MD 01/23/2017 3:46 PM  [facility] [address] [place], [place] [address]  Phone [phone] Fax [phone]

## 2017-02-27 ENCOUNTER — Other Ambulatory Visit: Payer: Self-pay | Admitting: Orthopedic Surgery

## 2017-03-18 ENCOUNTER — Other Ambulatory Visit: Payer: Self-pay | Admitting: Neurology

## 2017-04-11 ENCOUNTER — Encounter (HOSPITAL_COMMUNITY)
Admission: RE | Admit: 2017-04-11 | Discharge: 2017-04-11 | Disposition: A | Discharge status: Home / Self Care | Payer: Medicare Other | Source: Ambulatory Visit | Attending: Orthopedic Surgery | Admitting: Orthopedic Surgery

## 2017-04-11 ENCOUNTER — Encounter (HOSPITAL_COMMUNITY): Payer: Self-pay

## 2017-04-11 DIAGNOSIS — Z01818 Encounter for other preprocedural examination: Secondary | ICD-10-CM | POA: Diagnosis not present

## 2017-04-11 DIAGNOSIS — M1711 Unilateral primary osteoarthritis, right knee: Secondary | ICD-10-CM | POA: Diagnosis not present

## 2017-04-11 HISTORY — DX: Chronic obstructive pulmonary disease, unspecified: J44.9

## 2017-04-11 HISTORY — DX: Anxiety disorder, unspecified: F41.9

## 2017-04-11 HISTORY — DX: Depression, unspecified: F32.A

## 2017-04-11 HISTORY — DX: Major depressive disorder, single episode, unspecified: F32.9

## 2017-04-11 LAB — COMPREHENSIVE METABOLIC PANEL
ALBUMIN: 3.9 g/dL (ref 3.5–5.0)
ALK PHOS: 78 U/L (ref 38–126)
ALT: 17 U/L (ref 14–54)
AST: 26 U/L (ref 15–41)
Anion gap: 7 (ref 5–15)
BUN: 13 mg/dL (ref 6–20)
CALCIUM: 9.1 mg/dL (ref 8.9–10.3)
CO2: 25 mmol/L (ref 22–32)
CREATININE: 1.18 mg/dL — AB (ref 0.44–1.00)
Chloride: 108 mmol/L (ref 101–111)
GFR calc Af Amer: 55 mL/min — ABNORMAL LOW (ref >60)
GFR calc non Af Amer: 48 mL/min — ABNORMAL LOW (ref >60)
GLUCOSE: 79 mg/dL (ref 65–99)
Potassium: 3.6 mmol/L (ref 3.5–5.1)
SODIUM: 140 mmol/L (ref 135–145)
Total Bilirubin: 0.5 mg/dL (ref 0.3–1.2)
Total Protein: 6.1 g/dL — ABNORMAL LOW (ref 6.5–8.1)

## 2017-04-11 LAB — CBC WITH DIFFERENTIAL/PLATELET
BASOS PCT: 0 %
Basophils Absolute: 0 10*3/uL (ref 0.0–0.1)
EOS ABS: 1.7 10*3/uL — AB (ref 0.0–0.7)
Eosinophils Relative: 18 %
HCT: 38 % (ref 36.0–46.0)
Hemoglobin: 12.3 g/dL (ref 12.0–15.0)
LYMPHS PCT: 36 %
Lymphs Abs: 3.4 10*3/uL (ref 0.7–4.0)
MCH: 30.8 pg (ref 26.0–34.0)
MCHC: 32.4 g/dL (ref 30.0–36.0)
MCV: 95 fL (ref 78.0–100.0)
Monocytes Absolute: 0.6 10*3/uL (ref 0.1–1.0)
Monocytes Relative: 7 %
NEUTROS ABS: 3.6 10*3/uL (ref 1.7–7.7)
NEUTROS PCT: 39 %
Platelets: 277 10*3/uL (ref 150–400)
RBC: 4 MIL/uL (ref 3.87–5.11)
RDW: 13 % (ref 11.5–15.5)
WBC: 9.4 10*3/uL (ref 4.0–10.5)

## 2017-04-11 LAB — SURGICAL PCR SCREEN
MRSA, PCR: NEGATIVE
STAPHYLOCOCCUS AUREUS: NEGATIVE

## 2017-04-11 NOTE — Pre-Procedure Instructions (Signed)
Amy Glover  04/11/2017      [facility] [place], [place] AT [place] [address] Phone: [phone] Fax: [phone]  [place], [place] [facility] [address] [place] [address] [place] [address] Phone: [phone] Fax: [phone]    Your procedure is scheduled on August 27  Report to [place] at [place].M.  Call this number if you have problems the morning of surgery:  [phone]   Remember:  Do not eat food or drink liquids after midnight.  Continue all other medications as directed by your physician except follow these instructions about you medications   Take these medicines the morning of surgery with A SIP OF WATER ALPRAZolam (XANAX),  budesonide-formoterol (SYMBICORT) , carvedilol (COREG) , fluticasone (FLONASE), meclizine (ANTIVERT) if needed, omeprazole (PRILOSEC),  Tiotropium Bromide Monohydrate (SPIRIVA RESPIMAT) , topiramate (TOPAMAX) 100 , Vilazodone HCl (VIIBRYD)  7 days prior to surgery STOP taking any Aleve, Naproxen, Ibuprofen, Motrin, Advil, Goody's, BC's, all herbal medications, fish oil, and all vitamins, meloxicam (MOBIC)  Follow your doctors instructions regarding your Aspirin.  If no instructions were given by the doctor you will need to call the office to get instructions.  Your pre admission RN will also call for those instructions     Do not wear jewelry, make-up or nail polish.  Do not wear lotions, powders, or perfumes, or deoderant.  Do not shave 48 hours prior to surgery.    Do not bring valuables to the hospital.  [facility] is not responsible for any belongings or valuables.  Contacts, dentures or bridgework may not be worn into surgery.  Leave your suitcase in the car.  After surgery it may be brought to your room.  For patients admitted to the hospital, discharge time will  be determined by your treatment team.  Patients discharged the day of surgery will not be allowed to drive home.    Special instructions:   [place]- Preparing For Surgery  Before surgery, you can play an important role. Because skin is not sterile, your skin needs to be as free of germs as possible. You can reduce the number of germs on your skin by washing with CHG (chlorahexidine gluconate) Soap before surgery.  CHG is an antiseptic cleaner which kills germs and bonds with the skin to continue killing germs even after washing.  Please do not use if you have an allergy to CHG or antibacterial soaps. If your skin becomes reddened/irritated stop using the CHG.  Do not shave (including legs and underarms) for at least 48 hours prior to first CHG shower. It is OK to shave your face.  Please follow these instructions carefully.   1. Shower the NIGHT BEFORE SURGERY and the MORNING OF SURGERY with CHG.   2. If you chose to wash your hair, wash your hair first as usual with your normal shampoo.  3. After you shampoo, rinse your hair and body thoroughly to remove the shampoo.  4. Use CHG as you would any other liquid soap. You can apply CHG directly to the skin and wash gently with a scrungie or a clean washcloth.   5. Apply the CHG Soap to your body ONLY FROM THE NECK DOWN.  Do not use on open wounds or open sores. Avoid contact with your eyes, ears, mouth and genitals (private parts).  Wash genitals (private parts) with your normal soap.  6. Wash thoroughly, paying special attention to the area where your surgery will be performed.  7. Thoroughly rinse your body with warm water from the neck down.  8. DO NOT shower/wash with your normal soap after using and rinsing off the CHG Soap.  9. Pat yourself dry with a CLEAN TOWEL.   10. Wear CLEAN PAJAMAS   11. Place CLEAN SHEETS on your bed the night of your first shower and DO NOT SLEEP WITH PETS.    Day of Surgery: Do not apply any  deodorants/lotions. Please wear clean clothes to the hospital/surgery center.      Please read over the following fact sheets that you were given.

## 2017-04-18 MED ORDER — TRANEXAMIC ACID 1000 MG/10ML IV SOLN
1000.0000 mg | INTRAVENOUS | Status: AC
  Administered 2017-04-21: 1000 mg via INTRAVENOUS
  Filled 2017-04-18: qty 1100

## 2017-04-18 MED ORDER — GABAPENTIN 300 MG PO CAPS
300.0000 mg | ORAL_CAPSULE | Freq: Once | ORAL | Status: AC
  Administered 2017-04-21: 300 mg via ORAL
  Filled 2017-04-18: qty 1

## 2017-04-18 MED ORDER — DEXAMETHASONE SODIUM PHOSPHATE 10 MG/ML IJ SOLN
8.0000 mg | Freq: Once | INTRAMUSCULAR | Status: AC
  Administered 2017-04-21: 8 mg via INTRAVENOUS
  Filled 2017-04-18: qty 1

## 2017-04-18 MED ORDER — BUPIVACAINE LIPOSOME 1.3 % IJ SUSP
20.0000 mL | INTRAMUSCULAR | Status: AC
  Administered 2017-04-21: 20 mL
  Filled 2017-04-18: qty 20

## 2017-04-18 MED ORDER — CEFAZOLIN SODIUM-DEXTROSE 2-4 GM/100ML-% IV SOLN
2.0000 g | INTRAVENOUS | Status: AC
  Administered 2017-04-21: 2 g via INTRAVENOUS
  Filled 2017-04-18: qty 100

## 2017-04-18 MED ORDER — ACETAMINOPHEN 500 MG PO TABS
1000.0000 mg | ORAL_TABLET | Freq: Once | ORAL | Status: AC
  Administered 2017-04-21: 1000 mg via ORAL
  Filled 2017-04-18: qty 2

## 2017-04-21 ENCOUNTER — Ambulatory Visit (HOSPITAL_COMMUNITY): Payer: Medicare Other | Admitting: Certified Registered Nurse Anesthetist

## 2017-04-21 ENCOUNTER — Encounter (HOSPITAL_COMMUNITY): Payer: Self-pay

## 2017-04-21 ENCOUNTER — Encounter (HOSPITAL_COMMUNITY)
Admission: RE | Disposition: A | Discharge status: Home / Self Care | Payer: Self-pay | Source: Ambulatory Visit | Attending: Orthopedic Surgery

## 2017-04-21 ENCOUNTER — Observation Stay (HOSPITAL_COMMUNITY)
Admission: RE | Admit: 2017-04-21 | Discharge: 2017-04-22 | Disposition: A | Discharge status: Home / Self Care | Payer: Medicare Other | Source: Ambulatory Visit | Attending: Orthopedic Surgery | Admitting: Orthopedic Surgery

## 2017-04-21 DIAGNOSIS — M1711 Unilateral primary osteoarthritis, right knee: Principal | ICD-10-CM | POA: Insufficient documentation

## 2017-04-21 DIAGNOSIS — F329 Major depressive disorder, single episode, unspecified: Secondary | ICD-10-CM | POA: Insufficient documentation

## 2017-04-21 DIAGNOSIS — F419 Anxiety disorder, unspecified: Secondary | ICD-10-CM | POA: Insufficient documentation

## 2017-04-21 DIAGNOSIS — Z79899 Other long term (current) drug therapy: Secondary | ICD-10-CM | POA: Insufficient documentation

## 2017-04-21 DIAGNOSIS — Z87891 Personal history of nicotine dependence: Secondary | ICD-10-CM | POA: Diagnosis not present

## 2017-04-21 DIAGNOSIS — K219 Gastro-esophageal reflux disease without esophagitis: Secondary | ICD-10-CM | POA: Insufficient documentation

## 2017-04-21 DIAGNOSIS — Z7982 Long term (current) use of aspirin: Secondary | ICD-10-CM | POA: Insufficient documentation

## 2017-04-21 DIAGNOSIS — E785 Hyperlipidemia, unspecified: Secondary | ICD-10-CM | POA: Diagnosis not present

## 2017-04-21 DIAGNOSIS — J449 Chronic obstructive pulmonary disease, unspecified: Secondary | ICD-10-CM | POA: Insufficient documentation

## 2017-04-21 DIAGNOSIS — Z96659 Presence of unspecified artificial knee joint: Secondary | ICD-10-CM

## 2017-04-21 HISTORY — PX: TOTAL KNEE ARTHROPLASTY: SHX125

## 2017-04-21 SURGERY — ARTHROPLASTY, KNEE, TOTAL
Anesthesia: Monitor Anesthesia Care | Site: Knee | Laterality: Right

## 2017-04-21 MED ORDER — TRANEXAMIC ACID 1000 MG/10ML IV SOLN
1000.0000 mg | Freq: Once | INTRAVENOUS | Status: AC
  Administered 2017-04-21: 1000 mg via INTRAVENOUS
  Filled 2017-04-21: qty 10

## 2017-04-21 MED ORDER — DOCUSATE SODIUM 100 MG PO CAPS
100.0000 mg | ORAL_CAPSULE | Freq: Two times a day (BID) | ORAL | Status: DC
  Administered 2017-04-21 – 2017-04-22 (×3): 100 mg via ORAL
  Filled 2017-04-21 (×3): qty 1

## 2017-04-21 MED ORDER — SENNOSIDES-DOCUSATE SODIUM 8.6-50 MG PO TABS: 1.0000 | ORAL_TABLET | Freq: Every evening | ORAL | Status: DC | PRN

## 2017-04-21 MED ORDER — CHLORHEXIDINE GLUCONATE 4 % EX LIQD: 60.0000 mL | Freq: Once | CUTANEOUS | Status: DC

## 2017-04-21 MED ORDER — PHENOL 1.4 % MT LIQD: 1.0000 | OROMUCOSAL | Status: DC | PRN

## 2017-04-21 MED ORDER — ATORVASTATIN CALCIUM 40 MG PO TABS
40.0000 mg | ORAL_TABLET | Freq: Every day | ORAL | Status: DC
  Administered 2017-04-21 – 2017-04-22 (×2): 40 mg via ORAL
  Filled 2017-04-21 (×2): qty 1

## 2017-04-21 MED ORDER — VILAZODONE HCL 40 MG PO TABS
40.0000 mg | ORAL_TABLET | Freq: Every day | ORAL | Status: DC
  Administered 2017-04-22: 40 mg via ORAL
  Filled 2017-04-21: qty 1

## 2017-04-21 MED ORDER — ASPIRIN EC 325 MG PO TBEC
325.0000 mg | DELAYED_RELEASE_TABLET | Freq: Two times a day (BID) | ORAL | Status: DC
  Administered 2017-04-21 – 2017-04-22 (×3): 325 mg via ORAL
  Filled 2017-04-21 (×3): qty 1

## 2017-04-21 MED ORDER — HYDROMORPHONE HCL 1 MG/ML IJ SOLN: 0.2500 mg | INTRAMUSCULAR | Status: DC | PRN

## 2017-04-21 MED ORDER — HYDROMORPHONE HCL 1 MG/ML IJ SOLN: 0.5000 mg | INTRAMUSCULAR | Status: DC | PRN

## 2017-04-21 MED ORDER — FENTANYL CITRATE (PF) 250 MCG/5ML IJ SOLN
INTRAMUSCULAR | Status: AC
Start: 2017-04-21 — End: ?
  Filled 2017-04-21: qty 5

## 2017-04-21 MED ORDER — ACETAMINOPHEN 650 MG RE SUPP: 650.0000 mg | Freq: Four times a day (QID) | RECTAL | Status: DC | PRN

## 2017-04-21 MED ORDER — ZOLPIDEM TARTRATE 5 MG PO TABS: 5.0000 mg | ORAL_TABLET | Freq: Every evening | ORAL | Status: DC | PRN

## 2017-04-21 MED ORDER — PHENYLEPHRINE HCL 10 MG/ML IJ SOLN
INTRAVENOUS | Status: DC | PRN
  Administered 2017-04-21: 25 ug/min via INTRAVENOUS

## 2017-04-21 MED ORDER — DEXAMETHASONE SODIUM PHOSPHATE 10 MG/ML IJ SOLN
10.0000 mg | Freq: Once | INTRAMUSCULAR | Status: AC
  Administered 2017-04-22: 10 mg via INTRAVENOUS

## 2017-04-21 MED ORDER — FENTANYL CITRATE (PF) 100 MCG/2ML IJ SOLN
50.0000 ug | Freq: Once | INTRAMUSCULAR | Status: AC
  Administered 2017-04-21: 50 ug via INTRAVENOUS

## 2017-04-21 MED ORDER — 0.9 % SODIUM CHLORIDE (POUR BTL) OPTIME
TOPICAL | Status: DC | PRN
  Administered 2017-04-21: 1000 mL

## 2017-04-21 MED ORDER — SODIUM CHLORIDE 0.9 % IJ SOLN
INTRAMUSCULAR | Status: DC | PRN
  Administered 2017-04-21 (×2): 10 mL

## 2017-04-21 MED ORDER — FENTANYL CITRATE (PF) 100 MCG/2ML IJ SOLN: 50.0000 ug | Freq: Once | INTRAMUSCULAR | Status: DC

## 2017-04-21 MED ORDER — ACETAMINOPHEN 500 MG PO TABS
1000.0000 mg | ORAL_TABLET | Freq: Four times a day (QID) | ORAL | Status: DC
  Administered 2017-04-21 – 2017-04-22 (×3): 1000 mg via ORAL
  Filled 2017-04-21 (×4): qty 2

## 2017-04-21 MED ORDER — METOCLOPRAMIDE HCL 5 MG/ML IJ SOLN: 5.0000 mg | Freq: Three times a day (TID) | INTRAMUSCULAR | Status: DC | PRN

## 2017-04-21 MED ORDER — BUPIVACAINE-EPINEPHRINE (PF) 0.25% -1:200000 IJ SOLN
INTRAMUSCULAR | Status: DC | PRN
  Administered 2017-04-21: 30 mL via PERINEURAL

## 2017-04-21 MED ORDER — MIDAZOLAM HCL 2 MG/2ML IJ SOLN
INTRAMUSCULAR | Status: AC
  Filled 2017-04-21: qty 2

## 2017-04-21 MED ORDER — FENTANYL CITRATE (PF) 100 MCG/2ML IJ SOLN
INTRAMUSCULAR | Status: AC
  Administered 2017-04-21: 50 ug via INTRAVENOUS
  Filled 2017-04-21: qty 2

## 2017-04-21 MED ORDER — ONDANSETRON HCL 4 MG/2ML IJ SOLN: 4.0000 mg | Freq: Once | INTRAMUSCULAR | Status: DC | PRN

## 2017-04-21 MED ORDER — CARVEDILOL 3.125 MG PO TABS
3.1250 mg | ORAL_TABLET | Freq: Two times a day (BID) | ORAL | Status: DC
  Filled 2017-04-21: qty 1

## 2017-04-21 MED ORDER — MEPERIDINE HCL 25 MG/ML IJ SOLN: 6.2500 mg | INTRAMUSCULAR | Status: DC | PRN

## 2017-04-21 MED ORDER — FENTANYL CITRATE (PF) 100 MCG/2ML IJ SOLN
INTRAMUSCULAR | Status: DC | PRN
  Administered 2017-04-21: 50 ug via INTRAVENOUS

## 2017-04-21 MED ORDER — MENTHOL 3 MG MT LOZG: 1.0000 | LOZENGE | OROMUCOSAL | Status: DC | PRN

## 2017-04-21 MED ORDER — ONDANSETRON HCL 4 MG/2ML IJ SOLN: 4.0000 mg | Freq: Four times a day (QID) | INTRAMUSCULAR | Status: DC | PRN

## 2017-04-21 MED ORDER — MECLIZINE HCL 25 MG PO TABS
25.0000 mg | ORAL_TABLET | Freq: Three times a day (TID) | ORAL | Status: DC | PRN
  Administered 2017-04-22: 25 mg via ORAL
  Filled 2017-04-21 (×2): qty 1

## 2017-04-21 MED ORDER — DIPHENHYDRAMINE HCL 12.5 MG/5ML PO ELIX: 12.5000 mg | ORAL_SOLUTION | ORAL | Status: DC | PRN

## 2017-04-21 MED ORDER — SODIUM CHLORIDE 0.9 % IR SOLN
Status: DC | PRN
  Administered 2017-04-21: 1000 mL

## 2017-04-21 MED ORDER — FUROSEMIDE 40 MG PO TABS
40.0000 mg | ORAL_TABLET | Freq: Every day | ORAL | Status: DC
  Filled 2017-04-21 (×2): qty 1

## 2017-04-21 MED ORDER — CEFAZOLIN SODIUM-DEXTROSE 1-4 GM/50ML-% IV SOLN
1.0000 g | Freq: Four times a day (QID) | INTRAVENOUS | Status: AC
  Administered 2017-04-21 (×2): 1 g via INTRAVENOUS
  Filled 2017-04-21 (×2): qty 50

## 2017-04-21 MED ORDER — MIDAZOLAM HCL 2 MG/2ML IJ SOLN: 1.0000 mg | Freq: Once | INTRAMUSCULAR | Status: DC

## 2017-04-21 MED ORDER — MIDAZOLAM HCL 5 MG/5ML IJ SOLN
INTRAMUSCULAR | Status: DC | PRN
  Administered 2017-04-21: 1 mg via INTRAVENOUS

## 2017-04-21 MED ORDER — OXYCODONE HCL 5 MG PO TABS
5.0000 mg | ORAL_TABLET | ORAL | Status: DC | PRN
  Administered 2017-04-21: 5 mg via ORAL
  Administered 2017-04-22 (×2): 10 mg via ORAL
  Filled 2017-04-21: qty 1
  Filled 2017-04-21 (×2): qty 2

## 2017-04-21 MED ORDER — METHOCARBAMOL 1000 MG/10ML IJ SOLN
500.0000 mg | Freq: Four times a day (QID) | INTRAVENOUS | Status: DC | PRN
  Filled 2017-04-21: qty 5

## 2017-04-21 MED ORDER — ALPRAZOLAM 0.25 MG PO TABS: 0.2500 mg | ORAL_TABLET | Freq: Two times a day (BID) | ORAL | Status: DC | PRN

## 2017-04-21 MED ORDER — PANTOPRAZOLE SODIUM 40 MG PO TBEC
80.0000 mg | DELAYED_RELEASE_TABLET | Freq: Every day | ORAL | Status: DC
  Administered 2017-04-22: 80 mg via ORAL
  Filled 2017-04-21: qty 2

## 2017-04-21 MED ORDER — BUPIVACAINE IN DEXTROSE 0.75-8.25 % IT SOLN
INTRATHECAL | Status: DC | PRN
  Administered 2017-04-21: 2 mL via INTRATHECAL

## 2017-04-21 MED ORDER — LACTATED RINGERS IV SOLN
INTRAVENOUS | Status: DC
  Administered 2017-04-21 (×3): via INTRAVENOUS

## 2017-04-21 MED ORDER — ALUM & MAG HYDROXIDE-SIMETH 200-200-20 MG/5ML PO SUSP: 30.0000 mL | ORAL | Status: DC | PRN

## 2017-04-21 MED ORDER — PROPOFOL 500 MG/50ML IV EMUL
INTRAVENOUS | Status: DC | PRN
  Administered 2017-04-21: 50 ug/kg/min via INTRAVENOUS

## 2017-04-21 MED ORDER — HYDROCODONE-ACETAMINOPHEN 7.5-325 MG PO TABS
1.0000 | ORAL_TABLET | Freq: Four times a day (QID) | ORAL | Status: DC
  Administered 2017-04-21 – 2017-04-22 (×4): 1 via ORAL
  Filled 2017-04-21 (×4): qty 1

## 2017-04-21 MED ORDER — ACETAMINOPHEN 325 MG PO TABS: 650.0000 mg | ORAL_TABLET | Freq: Four times a day (QID) | ORAL | Status: DC | PRN

## 2017-04-21 MED ORDER — MIDAZOLAM HCL 2 MG/2ML IJ SOLN
INTRAMUSCULAR | Status: AC
  Administered 2017-04-21: 2 mg via INTRAVENOUS
  Filled 2017-04-21: qty 2

## 2017-04-21 MED ORDER — MOMETASONE FURO-FORMOTEROL FUM 200-5 MCG/ACT IN AERO
2.0000 | INHALATION_SPRAY | Freq: Two times a day (BID) | RESPIRATORY_TRACT | Status: DC
  Administered 2017-04-21: 2 via RESPIRATORY_TRACT
  Filled 2017-04-21: qty 8.8

## 2017-04-21 MED ORDER — BUPIVACAINE-EPINEPHRINE (PF) 0.25% -1:200000 IJ SOLN
INTRAMUSCULAR | Status: AC
  Filled 2017-04-21: qty 30

## 2017-04-21 MED ORDER — POTASSIUM CHLORIDE CRYS ER 20 MEQ PO TBCR
40.0000 meq | EXTENDED_RELEASE_TABLET | Freq: Every day | ORAL | Status: DC
  Administered 2017-04-21 – 2017-04-22 (×2): 40 meq via ORAL
  Filled 2017-04-21 (×2): qty 2

## 2017-04-21 MED ORDER — METHOCARBAMOL 500 MG PO TABS
500.0000 mg | ORAL_TABLET | Freq: Four times a day (QID) | ORAL | Status: DC | PRN
  Administered 2017-04-21 – 2017-04-22 (×2): 500 mg via ORAL
  Filled 2017-04-21 (×2): qty 1

## 2017-04-21 MED ORDER — FLEET ENEMA 7-19 GM/118ML RE ENEM: 1.0000 | ENEMA | Freq: Once | RECTAL | Status: DC | PRN

## 2017-04-21 MED ORDER — ONDANSETRON HCL 4 MG PO TABS
4.0000 mg | ORAL_TABLET | Freq: Four times a day (QID) | ORAL | Status: DC | PRN
  Administered 2017-04-22: 4 mg via ORAL
  Filled 2017-04-21: qty 1

## 2017-04-21 MED ORDER — BISACODYL 5 MG PO TBEC: 5.0000 mg | DELAYED_RELEASE_TABLET | Freq: Every day | ORAL | Status: DC | PRN

## 2017-04-21 MED ORDER — METOCLOPRAMIDE HCL 5 MG PO TABS: 5.0000 mg | ORAL_TABLET | Freq: Three times a day (TID) | ORAL | Status: DC | PRN

## 2017-04-21 MED ORDER — MIDAZOLAM HCL 2 MG/2ML IJ SOLN
2.0000 mg | Freq: Once | INTRAMUSCULAR | Status: AC
  Administered 2017-04-21: 2 mg via INTRAVENOUS

## 2017-04-21 MED ORDER — LISINOPRIL 5 MG PO TABS
5.0000 mg | ORAL_TABLET | Freq: Every day | ORAL | Status: DC
  Filled 2017-04-21: qty 1

## 2017-04-21 MED ORDER — TOPIRAMATE 100 MG PO TABS
100.0000 mg | ORAL_TABLET | Freq: Two times a day (BID) | ORAL | Status: DC
  Administered 2017-04-21 – 2017-04-22 (×2): 100 mg via ORAL
  Filled 2017-04-21 (×2): qty 1

## 2017-04-21 MED ORDER — PROPOFOL 10 MG/ML IV BOLUS
INTRAVENOUS | Status: DC | PRN
  Administered 2017-04-21: 20 mg via INTRAVENOUS

## 2017-04-21 MED ORDER — GABAPENTIN 300 MG PO CAPS
300.0000 mg | ORAL_CAPSULE | Freq: Three times a day (TID) | ORAL | Status: DC
  Administered 2017-04-21 – 2017-04-22 (×4): 300 mg via ORAL
  Filled 2017-04-21 (×4): qty 1

## 2017-04-21 SURGICAL SUPPLY — 66 items
BANDAGE ACE 6X5 VEL STRL LF (GAUZE/BANDAGES/DRESSINGS) ×3 IMPLANT
BANDAGE ELASTIC 6 VELCRO ST LF (GAUZE/BANDAGES/DRESSINGS) ×3 IMPLANT
BANDAGE ESMARK 6X9 LF (GAUZE/BANDAGES/DRESSINGS) ×1 IMPLANT
BLADE SAGITTAL 13X1.27X60 (BLADE) ×2 IMPLANT
BLADE SAGITTAL 13X1.27X60MM (BLADE) ×1
BLADE SAW SGTL 83.5X18.5 (BLADE) ×3 IMPLANT
BLADE SURG 10 STRL SS (BLADE) ×3 IMPLANT
BNDG ESMARK 6X9 LF (GAUZE/BANDAGES/DRESSINGS) ×3
BOWL SMART MIX CTS (DISPOSABLE) ×3 IMPLANT
CAPT KNEE TOTAL 3 ×3 IMPLANT
CEMENT BONE SIMPLEX SPEEDSET (Cement) ×6 IMPLANT
CLOSURE WOUND 1/2 X4 (GAUZE/BANDAGES/DRESSINGS) ×1
CLOSURE WOUND 1/4 X3 (GAUZE/BANDAGES/DRESSINGS) ×1
COVER SURGICAL LIGHT HANDLE (MISCELLANEOUS) ×3 IMPLANT
CUFF TOURNIQUET SINGLE 34IN LL (TOURNIQUET CUFF) ×3 IMPLANT
DRAPE EXTREMITY T 121X128X90 (DRAPE) ×3 IMPLANT
DRAPE HALF SHEET 40X57 (DRAPES) ×3 IMPLANT
DRAPE INCISE IOBAN 66X45 STRL (DRAPES) ×6 IMPLANT
DRAPE U-SHAPE 47X51 STRL (DRAPES) ×3 IMPLANT
DRSG AQUACEL AG ADV 3.5X10 (GAUZE/BANDAGES/DRESSINGS) ×3 IMPLANT
DURAPREP 26ML APPLICATOR (WOUND CARE) ×6 IMPLANT
ELECT PENCIL ROCKER SW 15FT (MISCELLANEOUS) ×3 IMPLANT
ELECT REM PT RETURN 9FT ADLT (ELECTROSURGICAL) ×3
ELECTRODE REM PT RTRN 9FT ADLT (ELECTROSURGICAL) ×1 IMPLANT
FILTER STRAW FLUID ASPIR (MISCELLANEOUS) IMPLANT
GLOVE BIOGEL M 7.0 STRL (GLOVE) IMPLANT
GLOVE BIOGEL PI IND STRL 7.5 (GLOVE) IMPLANT
GLOVE BIOGEL PI IND STRL 8.5 (GLOVE) ×3 IMPLANT
GLOVE BIOGEL PI INDICATOR 7.5 (GLOVE)
GLOVE BIOGEL PI INDICATOR 8.5 (GLOVE) ×6
GLOVE SURG ORTHO 8.0 STRL STRW (GLOVE) ×9 IMPLANT
GOWN STRL REUS W/ TWL LRG LVL3 (GOWN DISPOSABLE) ×1 IMPLANT
GOWN STRL REUS W/ TWL XL LVL3 (GOWN DISPOSABLE) ×4 IMPLANT
GOWN STRL REUS W/TWL 2XL LVL3 (GOWN DISPOSABLE) ×3 IMPLANT
GOWN STRL REUS W/TWL LRG LVL3 (GOWN DISPOSABLE) ×2
GOWN STRL REUS W/TWL XL LVL3 (GOWN DISPOSABLE) ×8
HANDPIECE INTERPULSE COAX TIP (DISPOSABLE) ×2
HOOD PEEL AWAY FACE SHEILD DIS (HOOD) ×15 IMPLANT
IV NS 1000ML (IV SOLUTION) ×2
IV NS 1000ML BAXH (IV SOLUTION) ×1 IMPLANT
KIT BASIN OR (CUSTOM PROCEDURE TRAY) ×3 IMPLANT
KIT ROOM TURNOVER OR (KITS) ×3 IMPLANT
KNEE CAPITATED TOTAL 3 ×1 IMPLANT
MANIFOLD NEPTUNE II (INSTRUMENTS) ×3 IMPLANT
NEEDLE 18GX1X1/2 (RX/OR ONLY) (NEEDLE) IMPLANT
NEEDLE 22X1 1/2 (OR ONLY) (NEEDLE) ×6 IMPLANT
NS IRRIG 1000ML POUR BTL (IV SOLUTION) ×3 IMPLANT
PACK TOTAL JOINT (CUSTOM PROCEDURE TRAY) ×3 IMPLANT
PAD ARMBOARD 7.5X6 YLW CONV (MISCELLANEOUS) ×6 IMPLANT
SET HNDPC FAN SPRY TIP SCT (DISPOSABLE) ×1 IMPLANT
STRIP CLOSURE SKIN 1/2X4 (GAUZE/BANDAGES/DRESSINGS) ×2 IMPLANT
STRIP CLOSURE SKIN 1/4X3 (GAUZE/BANDAGES/DRESSINGS) ×2 IMPLANT
SUCTION FRAZIER HANDLE 10FR (MISCELLANEOUS)
SUCTION TUBE FRAZIER 10FR DISP (MISCELLANEOUS) IMPLANT
SUT MNCRL AB 3-0 PS2 18 (SUTURE) ×3 IMPLANT
SUT VIC AB 0 CTB1 27 (SUTURE) ×6 IMPLANT
SUT VIC AB 1 CT1 27 (SUTURE) ×4
SUT VIC AB 1 CT1 27XBRD ANBCTR (SUTURE) ×2 IMPLANT
SUT VIC AB 2-0 CT1 27 (SUTURE) ×4
SUT VIC AB 2-0 CT1 TAPERPNT 27 (SUTURE) ×2 IMPLANT
SYR 20CC LL (SYRINGE) ×6 IMPLANT
SYR TB 1ML LUER SLIP (SYRINGE) IMPLANT
TOWEL OR 17X24 6PK STRL BLUE (TOWEL DISPOSABLE) ×3 IMPLANT
TOWEL OR 17X26 10 PK STRL BLUE (TOWEL DISPOSABLE) ×3 IMPLANT
TRAY CATH 16FR W/PLASTIC CATH (SET/KITS/TRAYS/PACK) ×3 IMPLANT
WRAP KNEE MAXI GEL POST OP (GAUZE/BANDAGES/DRESSINGS) ×3 IMPLANT

## 2017-04-21 NOTE — Anesthesia Procedure Notes (Signed)
Spinal  Patient location during procedure: OR Start time: 04/21/2017 10:05 AM End time: 04/21/2017 10:08 AM Staffing Anesthesiologist: Lillia Abed Performed: anesthesiologist  Preanesthetic Checklist Completed: patient identified, surgical consent, pre-op evaluation, timeout performed, IV checked, risks and benefits discussed and monitors and equipment checked Spinal Block Patient position: sitting Prep: DuraPrep Patient monitoring: blood pressure, continuous pulse ox, heart rate and cardiac monitor Approach: right paramedian Location: L3-4 Injection technique: single-shot Needle Needle type: Pencan  Needle gauge: 24 G Needle length: 9 cm Needle insertion depth: 5 cm

## 2017-04-21 NOTE — Anesthesia Procedure Notes (Signed)
Procedure Name: MAC Date/Time: 04/21/2017 10:08 AM Performed by: Everlean Cherry A Pre-anesthesia Checklist: Emergency Drugs available, Suction available, Patient being monitored and Patient identified Patient Re-evaluated:Patient Re-evaluated prior to induction Oxygen Delivery Method: Simple face mask

## 2017-04-21 NOTE — Anesthesia Postprocedure Evaluation (Signed)
Anesthesia Post Note  Patient: Amy Glover  Procedure(s) Performed: Procedure(s) (LRB): RIGHT TOTAL KNEE ARTHROPLASTY (Right)     Patient location during evaluation: PACU Anesthesia Type: MAC and Spinal Level of consciousness: awake and alert Pain management: pain level controlled Vital Signs Assessment: post-procedure vital signs reviewed and stable Respiratory status: spontaneous breathing, nonlabored ventilation, respiratory function stable and patient connected to nasal cannula oxygen Cardiovascular status: blood pressure returned to baseline and stable Postop Assessment: no signs of nausea or vomiting Anesthetic complications: no    Last Vitals:  Vitals:   04/21/17 1333 04/21/17 1348  BP: (!) 102/35 (!) 97/33  Pulse: 75 76  Resp: 11 12  Temp:    SpO2: 93% 94%    Last Pain:  Vitals:   04/21/17 1348  TempSrc:   PainSc: [name]

## 2017-04-21 NOTE — Evaluation (Signed)
Physical Therapy Evaluation Patient Details Name: Amy Glover MRN: [id number] DOB: 07/07/1951 Today's Date: 04/21/2017   History of Present Illness  Pt is a 66 y/o female s/p R TKA. PMH includes COPD, anxiety, abnormality of gait, falls, HTN, migraine, subclavian steal syndrome, and aneurysm s/p repair.   Clinical Impression  Pt is s/p surgery above with deficits below. PTA, pt reports history of falls, and reported she used RW for mobility. 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 Upon eval, pt presenting with decreased balance, and post op pain and weakness. Required min guard to min A for mobility this session. Reports family will be able to assist as needed upon d/c and will need DME below. Follow up recs per MD arrangements. Will continue to follow acutely to maximize functional mobility independence and safety.     Follow Up Recommendations DC plan and follow up therapy as arranged by surgeon;Supervision for mobility/OOB    Equipment Recommendations  Rolling walker with 5" wheels;3in1 (PT)    Recommendations for Other Services       Precautions / Restrictions Precautions Precautions: Knee Precaution Booklet Issued: Yes (comment) Precaution Comments: Reviewed supine ther ex with pt  Restrictions Weight Bearing Restrictions: Yes RLE Weight Bearing: Weight bearing as tolerated      Mobility  Bed Mobility Overal bed mobility: Needs Assistance Bed Mobility: Supine to Sit     Supine to sit: Supervision     General bed mobility comments: Supervision for safety. Use of bed rails.   Transfers Overall transfer level: Needs assistance Equipment used: Rolling walker (2 wheeled) Transfers: Sit to/from Stand Sit to Stand: Min assist         General transfer comment: Min A for steadying assist. Verbal cues for hand placement.   Ambulation/Gait Ambulation/Gait assistance: Min guard;Min assist Ambulation Distance (Feet): 25  Feet Assistive device: Rolling walker (2 wheeled) Gait Pattern/deviations: Step-to pattern;Decreased step length - right;Decreased step length - left;Decreased weight shift to right;Antalgic Gait velocity: Decreased Gait velocity interpretation: Below normal speed for age/gender General Gait Details: Slow, antalgic gait. Min to min guard for steadying assist, especially when backing up to chair. Verbal cues for sequencing throughout   Stairs            Wheelchair Mobility    Modified Rankin (Stroke Patients Only)       Balance Overall balance assessment: Needs assistance Sitting-balance support: No upper extremity supported;Feet supported Sitting balance-Leahy Scale: Good     Standing balance support: Bilateral upper extremity supported;During functional activity Standing balance-Leahy Scale: Poor Standing balance comment: Reliant on UE support for balance                              Pertinent Vitals/Pain Pain Assessment: 0-10 Pain Score: 7  Pain Location: R knee  Pain Descriptors / Indicators: Aching;Operative site guarding Pain Intervention(s): Limited activity within patient's tolerance;Monitored during session;Repositioned    Home Living Family/patient expects to be discharged to:: Private residence Living Arrangements: Spouse/significant other Available Help at Discharge: Family;Available 24 hours/day Type of Home: House Home Access: Ramped entrance     Home Layout: One level Home Equipment: Shower seat      Prior Function Level of Independence: Independent with assistive device(s)         Comments: Used RW for mobility      Hand Dominance   Dominant Hand: Right    Extremity/Trunk Assessment  Upper Extremity Assessment Upper Extremity Assessment: Defer to OT evaluation    Lower Extremity Assessment Lower Extremity Assessment: RLE deficits/detail RLE Deficits / Details: Sensory in tact. Deficits consistent with post op pain and  weakness. Able to perform exercises below.     Cervical / Trunk Assessment Cervical / Trunk Assessment: Normal  Communication   Communication: No difficulties  Cognition Arousal/Alertness: Awake/alert Behavior During Therapy: WFL for tasks assessed/performed Overall Cognitive Status: History of cognitive impairments - at baseline                                 General Comments: Reports aneurysm and had repair which 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       General Comments General comments (skin integrity, edema, etc.): Pt's daughter and husband present throughout session.     Exercises Total Joint Exercises Ankle Circles/Pumps: AROM;Both;20 reps;Supine Quad Sets: AROM;Right;10 reps;Supine Towel Squeeze: AROM;Both;10 reps;Supine Short Arc Quad: AROM;Right;10 reps;Supine Hip ABduction/ADduction: AROM;Right;10 reps;Supine   Assessment/Plan    PT Assessment Patient needs continued PT services  PT Problem List Decreased strength;Decreased range of motion;Decreased activity tolerance;Decreased balance;Decreased mobility;Decreased cognition;Decreased knowledge of use of DME;Decreased knowledge of precautions;Pain       PT Treatment Interventions DME instruction;Gait training;Therapeutic activities;Therapeutic exercise;Functional mobility training;Balance training;Neuromuscular re-education;Patient/family education    PT Goals (Current goals can be found in the Care Plan section)  Acute Rehab PT Goals Patient Stated Goal: to go home  PT Goal Formulation: With patient Time For Goal Achievement: 04/28/17 Potential to Achieve Goals: Good    Frequency 7X/week   Barriers to discharge        Co-evaluation               AM-PAC PT "6 Clicks" Daily Activity  Outcome Measure Difficulty turning over in bed (including adjusting bedclothes, sheets and blankets)?: None Difficulty moving from lying on back to sitting on the side of the bed? : None Difficulty sitting  down on and standing up from a chair with arms (e.g., wheelchair, bedside commode, etc,.)?: Unable Help needed moving to and from a bed to chair (including a wheelchair)?: A Little Help needed walking in hospital room?: A Little Help needed climbing 3-5 steps with a railing? : A Lot 6 Click Score: 17    End of Session Equipment Utilized During Treatment: Gait belt Activity Tolerance: Patient tolerated treatment well Patient left: in chair;with call bell/phone within reach;with family/visitor present Nurse Communication: Mobility status PT Visit Diagnosis: Unsteadiness on feet (R26.81);History of falling (Z91.81);Other abnormalities of gait and mobility (R26.89);Pain Pain - Right/Left: Right Pain - part of body: Knee    Time: 1750-1831 PT Time Calculation (min) (ACUTE ONLY): 41 min   Charges:   PT Evaluation $PT Eval Low Complexity: 1 Low PT Treatments $Gait Training: 8-22 mins   PT G Codes:   PT G-Codes **NOT FOR INPATIENT CLASS** Functional Assessment Tool Used: AM-PAC 6 Clicks Basic Mobility;Clinical judgement Functional Limitation: Mobility: Walking and moving around Mobility: Walking and Moving Around Current Status (M5465): At least 40 percent but less than 60 percent impaired, limited or restricted Mobility: Walking and Moving Around Goal Status [phone]): At least 1 percent but less than 20 percent impaired, limited or restricted    Leighton Ruff, PT, DPT  Acute Rehabilitation Services  Pager: [phone]   Rudean Hitt 04/21/2017, 7:08 PM

## 2017-04-21 NOTE — Transfer of Care (Signed)
Immediate Anesthesia Transfer of Care Note  Patient: Amy Glover  Procedure(s) Performed: Procedure(s): RIGHT TOTAL KNEE ARTHROPLASTY (Right)  Patient Location: PACU  Anesthesia Type:MAC combined with regional for post-op pain  Level of Consciousness: awake, alert , oriented and patient cooperative  Airway & Oxygen Therapy: Patient Spontanous Breathing and Patient connected to face mask oxygen  Post-op Assessment: Report given to RN and Post -op Vital signs reviewed and stable  Post vital signs: Reviewed and stable  Last Vitals:  Vitals:   04/21/17 0829 04/21/17 1148  BP: (!) 116/58 95/68  Pulse:  73  Resp:  13  Temp:  (!) 36.3 C  SpO2:  100%    Last Pain:  Vitals:   04/21/17 1148  TempSrc:   PainSc: (P) 0-No pain         Complications: No apparent anesthesia complications

## 2017-04-21 NOTE — Anesthesia Preprocedure Evaluation (Signed)
Anesthesia Evaluation  Patient identified by MRN, date of birth, ID band Patient awake    Reviewed: Allergy & Precautions, NPO status , Patient's Chart, lab work & pertinent test results  Airway Mallampati: I  TM Distance: >3 FB Neck ROM: Full    Dental   Pulmonary COPD, former smoker,    Pulmonary exam normal        Cardiovascular hypertension, Pt. on medications Normal cardiovascular exam     Neuro/Psych    GI/Hepatic GERD  Medicated and Controlled,  Endo/Other    Renal/GU      Musculoskeletal   Abdominal   Peds  Hematology   Anesthesia Other Findings   Reproductive/Obstetrics                             Anesthesia Physical Anesthesia Plan  ASA: II  Anesthesia Plan: Spinal and MAC   Post-op Pain Management:  Regional for Post-op pain   Induction: Intravenous  PONV Risk Score and Plan: 2 and Ondansetron and Dexamethasone  Airway Management Planned: Simple Face Mask  Additional Equipment:   Intra-op Plan:   Post-operative Plan:   Informed Consent: I have reviewed the patients History and Physical, chart, labs and discussed the procedure including the risks, benefits and alternatives for the proposed anesthesia with the patient or authorized representative who has indicated his/her understanding and acceptance.     Plan Discussed with: CRNA and Surgeon  Anesthesia Plan Comments:         Anesthesia Quick Evaluation

## 2017-04-21 NOTE — H&P (Signed)
Amy Glover MRN:  [id number] DOB/SEX:  [birth year]/female  CHIEF COMPLAINT:  Painful right Knee  HISTORY: Patient is a 66 y.o. female presented with a history of pain in the right knee. Onset of symptoms was gradual starting a few years ago with gradually worsening course since that time. Patient has been treated conservatively with over-the-counter NSAIDs and activity modification. Patient currently rates pain in the knee at 10 out of 10 with activity. There is pain at night.  PAST MEDICAL HISTORY: Patient Active Problem List   Diagnosis Date Noted  . Hyperlipidemia 08/15/2015  . Hypertensive heart disease 08/15/2015  . Non-ischemic cardiomyopathy ([place]) 08/15/2015  . Abnormality of gait 08/08/2015  . Chronic pain associated with significant psychosocial dysfunction 04/06/2015  . DDD (degenerative disc disease), lumbar 04/06/2015  . Arthropathy of lumbar facet joint ([place]) 04/06/2015  . Degenerative arthritis of lumbar spine 04/06/2015  . Class 1 obesity 04/06/2015  . Bursitis, trochanteric 04/06/2015  . HTN (hypertension) 04/06/2015  . Dilated cardiomyopathy ([place]) 04/06/2015  . Dizziness 04/06/2015  . Syncope 04/06/2015  . Frequent falls 04/18/2014  . Dizziness and giddiness 12/07/2013  . Migraine without aura 12/07/2013  . Piriformis syndrome 10/15/2012   Past Medical History:  Diagnosis Date  . Abnormality of gait 08/08/2015  . Anxiety   . Brain aneurysm    right ACOM  . COPD (chronic obstructive pulmonary disease) ([place])   . Depression   . Dizziness and giddiness 12/07/2013  . Dyslipidemia   . Fall   . GERD (gastroesophageal reflux disease)   . Hypertension   . Migraine without aura, without mention of intractable migraine without mention of status migrainosus 12/07/2013  . Subclavian steal syndrome    Past Surgical History:  Procedure Laterality Date  . BASIL CELL REMOVAL  2015   NOSE  . BRAIN SURGERY  04/20011   right ACOM aneurysm clip  . CESAREAN SECTION   1984  . IR GENERIC HISTORICAL  05/10/2016   IR RADIOLOGIST EVAL & MGMT 05/10/2016 [facility]  . IR GENERIC HISTORICAL  05/28/2016   IR ANGIO INTRA EXTRACRAN SEL COM CAROTID INNOMINATE BILAT MOD SED 05/28/2016 Luanne Bras, MD [facility]  . IR GENERIC HISTORICAL  05/28/2016   IR ANGIO VERTEBRAL SEL SUBCLAVIAN INNOMINATE BILAT MOD SED 05/28/2016 Luanne Bras, MD [facility]  . IR GENERIC HISTORICAL  05/28/2016   IR 3D INDEPENDENT WKST 05/28/2016 Luanne Bras, MD [facility]  . IR GENERIC HISTORICAL  06/12/2016   IR RADIOLOGIST EVAL & MGMT 06/12/2016 [facility]  . IR RADIOLOGIST EVAL & MGMT  01/09/2017  . KNEE ARTHROSCOPY     and shoulder  . SHOULDER SURGERY  2008  . STENT IN NECK  2009  . TONSILLECTOMY AND ADENOIDECTOMY  1961  . TUBAL LIGATION  1987  . WRIST SURGERY Right 2009     MEDICATIONS:   Prescriptions Prior to Admission  Medication Sig Dispense Refill Last Dose  . ALPRAZolam (XANAX) 0.25 MG tablet Take 0.25-0.5 mg by mouth 2 (two) times daily as needed for anxiety (with meclizine for anxiety and dizziness).   04/21/2017 at 0530  . aspirin EC 81 MG tablet Take 162 mg by mouth every evening.   04/11/2017  . atorvastatin (LIPITOR) 40 MG tablet Take 40 mg by mouth daily.    04/20/2017 at Unknown time  . b complex vitamins tablet Take 1 tablet by mouth daily.   04/11/2017  . budesonide-formoterol (SYMBICORT) 160-4.5 MCG/ACT inhaler Inhale 2 puffs into the lungs 2 (two) times daily.  04/20/2017 at Unknown time  . Calcium Carb-Cholecalciferol (CALCIUM 600+D3 PO) Take 2 tablets by mouth daily.   04/11/2017  . carvedilol (COREG) 6.25 MG tablet Take 3.125 mg by mouth 2 (two) times daily with a meal.    04/21/2017 at 0530  . Cholecalciferol (VITAMIN D3) 5000 UNITS TABS Take 1 tablet by mouth daily.   04/11/2017  . fluticasone (FLONASE) 50 MCG/ACT nasal spray Place 1 spray into both nostrils daily.   04/20/2017 at Unknown time  . furosemide (LASIX) 40 MG tablet Take 40 mg by  mouth daily.   04/20/2017 at Unknown time  . ibuprofen (ADVIL,MOTRIN) 400 MG tablet Take 400 mg by mouth every 6 (six) hours as needed.   Past Month at Unknown time  . lisinopril (PRINIVIL,ZESTRIL) 10 MG tablet Take 5 mg by mouth daily.  2 04/20/2017 at Unknown time  . meclizine (ANTIVERT) 25 MG tablet Take 25 mg by mouth 3 (three) times daily as needed for dizziness. Patient takes at least one daily.   04/21/2017 at Unknown time  . Nutritional Supplements (JUICE PLUS FIBRE PO) Take 1 Dose by mouth 2 (two) times daily.   04/11/2017  . Omega-3 Fatty Acids (FISH OIL) 1000 MG CAPS Take 1,000 mg by mouth 2 (two) times daily.   04/11/2017  . omeprazole (PRILOSEC) 40 MG capsule Take 40 mg by mouth daily.   04/21/2017 at 0530  . polycarbophil (FIBERCON) 625 MG tablet Take 625 mg by mouth every other day.   Past Month at Unknown time  . potassium chloride SA (K-DUR,KLOR-CON) 20 MEQ tablet Take 40 mEq by mouth daily.    04/11/2017  . promethazine (PHENERGAN) 25 MG tablet Take 25 mg by mouth every 8 (eight) hours as needed. For nausea   Past Week at Unknown time  . topiramate (TOPAMAX) 100 MG tablet TAKE 1 TABLET BY MOUTH TWO  TIMES DAILY 180 tablet 1 04/21/2017 at 0530  . Vilazodone HCl (VIIBRYD) 40 MG TABS Take 40 mg by mouth daily.   04/21/2017 at 0530  . zolpidem (AMBIEN) 10 MG tablet Take 5 mg by mouth at bedtime as needed for sleep. For sleep    04/20/2017 at Unknown time  . meloxicam (MOBIC) 15 MG tablet Take 15 mg by mouth daily.    04/11/2017    ALLERGIES:   Allergies  Allergen Reactions  . Estradiol Hives  . Sulfa Antibiotics Hives and Nausea And Vomiting    REVIEW OF SYSTEMS:  A comprehensive review of systems was negative except for: Musculoskeletal: positive for arthralgias and bone pain   FAMILY HISTORY:   Family History  Problem Relation Age of Onset  . Heart disease Mother   . Hypertension Father   . Cancer Father   . Multiple sclerosis Sister   . Seizures Sister     SOCIAL HISTORY:    Social History  Substance Use Topics  . Smoking status: Former Research scientist (life sciences)  . Smokeless tobacco: Never Used     Comment: QUIT 5 YEARS AGO  . Alcohol use 0.0 oz/week     Comment: occasional/monthly     EXAMINATION:  Vital signs in last 24 hours: Temp:  [97.5 F (36.4 C)] 97.5 F (36.4 C) (08/27 0815) Pulse Rate:  [75] 75 (08/27 0815) Resp:  [20] 20 (08/27 0815) BP: (116)/(58) 116/58 (08/27 0829) SpO2:  [100 %] 100 % (08/27 0815) Weight:  [74.7 kg (164 lb 11.2 oz)] 74.7 kg (164 lb 11.2 oz) (08/27 0803)  BP (!) 116/58   Pulse 75  Temp (!) 97.5 F (36.4 C) (Oral)   Resp 20   Wt 74.7 kg (164 lb 11.2 oz)   SpO2 100%   BMI 29.64 kg/m   General Appearance:    Alert, cooperative, no distress, appears stated age  Head:    Normocephalic, without obvious abnormality, atraumatic  Eyes:    PERRL, conjunctiva/corneas clear, EOM's intact, fundi    benign, both eyes  Ears:    Normal TM's and external ear canals, both ears  Nose:   Nares normal, septum midline, mucosa normal, no drainage    or sinus tenderness  Throat:   Lips, mucosa, and tongue normal; teeth and gums normal  Neck:   Supple, symmetrical, trachea midline, no adenopathy;    thyroid:  no enlargement/tenderness/nodules; no carotid   bruit or JVD  Back:     Symmetric, no curvature, ROM normal, no CVA tenderness  Lungs:     Clear to auscultation bilaterally, respirations unlabored  Chest Wall:    No tenderness or deformity   Heart:    Regular rate and rhythm, S1 and S2 normal, no murmur, rub   or gallop  Breast Exam:    No tenderness, masses, or nipple abnormality  Abdomen:     Soft, non-tender, bowel sounds active all four quadrants,    no masses, no organomegaly  Genitalia:    Normal female without lesion, discharge or tenderness  Rectal:    Normal tone, no masses or tenderness;   guaiac negative stool  Extremities:   Extremities normal, atraumatic, no cyanosis or edema  Pulses:   2+ and symmetric all extremities  Skin:    Skin color, texture, turgor normal, no rashes or lesions  Lymph nodes:   Cervical, supraclavicular, and axillary nodes normal  Neurologic:   CNII-XII intact, normal strength, sensation and reflexes    throughout    Musculoskeletal:  ROM 0-120, Ligaments intact,  Imaging Review Plain radiographs demonstrate severe degenerative joint disease of the right knee. The overall alignment is neutral. The bone quality appears to be excellent for age and reported activity level.  Assessment/Plan: Primary osteoarthritis, right knee   The patient history, physical examination and imaging studies are consistent with advanced degenerative joint disease of the right knee. The patient has failed conservative treatment.  The clearance notes were reviewed.  After discussion with the patient it was felt that Total Knee Replacement was indicated. The procedure,  risks, and benefits of total knee arthroplasty were presented and reviewed. The risks including but not limited to aseptic loosening, infection, blood clots, vascular injury, stiffness, patella tracking problems complications among others were discussed. The patient acknowledged the explanation, agreed to proceed with the plan.  Donia Ast 04/21/2017, 9:22 AM

## 2017-04-21 NOTE — Progress Notes (Signed)
Orthopedic Tech Progress Note Patient Details:  Amy Glover [birth year] [id number]  CPM Right Knee CPM Right Knee: On Right Knee Flexion (Degrees): 90 Right Knee Extension (Degrees): 0 Additional Comments: trapeze bar patient helper   Hildred Priest 04/21/2017, 12:09 PM Viewed order from doctor's order list

## 2017-04-22 ENCOUNTER — Encounter (HOSPITAL_COMMUNITY): Payer: Self-pay | Admitting: Orthopedic Surgery

## 2017-04-22 DIAGNOSIS — M1711 Unilateral primary osteoarthritis, right knee: Secondary | ICD-10-CM | POA: Diagnosis not present

## 2017-04-22 LAB — BASIC METABOLIC PANEL
ANION GAP: 8 (ref 5–15)
BUN: 12 mg/dL (ref 6–20)
CALCIUM: 8.7 mg/dL — AB (ref 8.9–10.3)
CO2: 23 mmol/L (ref 22–32)
CREATININE: 1.04 mg/dL — AB (ref 0.44–1.00)
Chloride: 110 mmol/L (ref 101–111)
GFR calc Af Amer: >60 mL/min (ref >60)
GFR, EST NON AFRICAN AMERICAN: 56 mL/min — AB (ref >60)
GLUCOSE: 128 mg/dL — AB (ref 65–99)
Potassium: 3 mmol/L — ABNORMAL LOW (ref 3.5–5.1)
Sodium: 141 mmol/L (ref 135–145)

## 2017-04-22 LAB — CBC
HCT: 33.4 % — ABNORMAL LOW (ref 36.0–46.0)
Hemoglobin: 10.8 g/dL — ABNORMAL LOW (ref 12.0–15.0)
MCH: 30.9 pg (ref 26.0–34.0)
MCHC: 32.3 g/dL (ref 30.0–36.0)
MCV: 95.4 fL (ref 78.0–100.0)
PLATELETS: 252 10*3/uL (ref 150–400)
RBC: 3.5 MIL/uL — ABNORMAL LOW (ref 3.87–5.11)
RDW: 13.2 % (ref 11.5–15.5)
WBC: 11.2 10*3/uL — AB (ref 4.0–10.5)

## 2017-04-22 MED ORDER — METHOCARBAMOL 500 MG PO TABS: 500.0000 mg | ORAL_TABLET | Freq: Four times a day (QID) | ORAL | 0 refills | Status: DC | PRN

## 2017-04-22 MED ORDER — ASPIRIN 325 MG PO TBEC: 325.0000 mg | DELAYED_RELEASE_TABLET | Freq: Two times a day (BID) | ORAL | 0 refills | Status: DC

## 2017-04-22 MED ORDER — OXYCODONE HCL 10 MG PO TABS: 10.0000 mg | ORAL_TABLET | ORAL | 0 refills | Status: DC | PRN

## 2017-04-22 NOTE — Progress Notes (Signed)
Physical Therapy Treatment Patient Details Name: Amy Glover MRN: [id number] DOB: December 23, 1950 Today's Date: 04/22/2017    History of Present Illness Pt is a 66 y/o female s/p R TKA. PMH includes COPD, anxiety, abnormality of gait, falls, HTN, migraine, subclavian steal syndrome, and aneurysm s/p repair.     PT Comments    Patient tolerated R LE therex and gait well with little increase in pain. Patient with one LOB when ambulating requiring min A to regain balance. Husband present. Current plan remains appropriate.    Follow Up Recommendations  DC plan and follow up therapy as arranged by surgeon;Supervision for mobility/OOB     Equipment Recommendations  Rolling walker with 5" wheels;3in1 (PT);Wheelchair (measurements PT);Wheelchair cushion (measurements PT)    Recommendations for Other Services       Precautions / Restrictions Precautions Precautions: Knee;Fall Precaution Comments: reviewed positioning with pt and husband Restrictions Weight Bearing Restrictions: Yes RLE Weight Bearing: Weight bearing as tolerated    Mobility  Bed Mobility Overal bed mobility: Modified Independent Bed Mobility: Supine to Sit;Sit to Supine     Supine to sit: Supervision Sit to supine: Supervision   General bed mobility comments: increased time and effort  Transfers Overall transfer level: Needs assistance Equipment used: Rolling walker (2 wheeled) Transfers: Sit to/from Stand Sit to Stand: Min guard         General transfer comment: cues for safe hand placement and min guard for safety  Ambulation/Gait Ambulation/Gait assistance: Min assist;Min guard Ambulation Distance (Feet): 100 Feet Assistive device: Rolling walker (2 wheeled) Gait Pattern/deviations: Step-through pattern;Decreased stance time - right;Decreased step length - left;Decreased step length - right;Decreased weight shift to right Gait velocity: Decreased   General Gait Details: slow cadence with one LOB  requiring assist to gain balance; cues for R heel strike and engagement of R quad during stance phase   Stairs            Wheelchair Mobility    Modified Rankin (Stroke Patients Only)       Balance   Sitting-balance support: No upper extremity supported;Feet supported Sitting balance-Leahy Scale: Good     Standing balance support: Bilateral upper extremity supported;During functional activity Standing balance-Leahy Scale: Poor                              Cognition Arousal/Alertness: Awake/alert Behavior During Therapy: WFL for tasks assessed/performed Overall Cognitive Status: History of cognitive impairments - at baseline                                 General Comments: Reports aneurysm and had repair which 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       Exercises Total Joint Exercises Quad Sets: AROM;Right;10 reps;Supine Short Arc Quad: AROM;Right;10 reps;Supine Heel Slides: AROM;Right;10 reps;Supine Hip ABduction/ADduction: AROM;Right;10 reps;Supine Straight Leg Raises: AROM;Right;10 reps;Supine Long Arc Quad: AROM;Right;10 reps;Seated Knee Flexion: AROM;Right;5 reps;Seated;Other (comment) (with 10 sec holds) Goniometric ROM: 0-90    General Comments General comments (skin integrity, edema, etc.): husband present      Pertinent Vitals/Pain Pain Assessment: Faces Faces Pain Scale: Hurts little more Pain Location: R knee  Pain Descriptors / Indicators: Guarding;Sore Pain Intervention(s): Monitored during session;Premedicated before session;Repositioned    Home Living                      Prior Function  PT Goals (current goals can now be found in the care plan section) Acute Rehab PT Goals Patient Stated Goal: to go home  Progress towards PT goals: Progressing toward goals    Frequency    7X/week      PT Plan Current plan remains appropriate    Co-evaluation              AM-PAC PT "6 Clicks"  Daily Activity  Outcome Measure  Difficulty turning over in bed (including adjusting bedclothes, sheets and blankets)?: None Difficulty moving from lying on back to sitting on the side of the bed? : A Lot Difficulty sitting down on and standing up from a chair with arms (e.g., wheelchair, bedside commode, etc,.)?: Unable Help needed moving to and from a bed to chair (including a wheelchair)?: A Little Help needed walking in hospital room?: A Little Help needed climbing 3-5 steps with a railing? : A Lot 6 Click Score: 15    End of Session Equipment Utilized During Treatment: Gait belt Activity Tolerance: Patient tolerated treatment well;Treatment limited secondary to medical complications (Comment) Patient left: in bed;with call bell/phone within reach;with family/visitor present Nurse Communication: Mobility status PT Visit Diagnosis: Unsteadiness on feet (R26.81);History of falling (Z91.81);Other abnormalities of gait and mobility (R26.89);Pain Pain - Right/Left: Right Pain - part of body: Knee     Time: 1315-1350 PT Time Calculation (min) (ACUTE ONLY): 35 min  Charges:  $Gait Training: 8-22 mins $Therapeutic Exercise: 8-22 mins                     G Codes:       Earney Navy, PTA Pager: [phone]      Darliss Cheney 04/22/2017, 2:37 PM

## 2017-04-22 NOTE — Discharge Summary (Signed)
SPORTS MEDICINE & JOINT REPLACEMENT   Lara Mulch, MD   Carlyon Shadow, PA-C [address], [place], [place]  [address]                             [phone]  PATIENT ID: Amy Glover        MRN:  [id number]          DOB/AGE: 65-27-1952 / 66 y.o.    DISCHARGE SUMMARY  ADMISSION DATE:    04/21/2017 DISCHARGE DATE:   04/22/2017   ADMISSION DIAGNOSIS: PRIMARY OSTEOARTHRITIS RIGHT KNEE    DISCHARGE DIAGNOSIS:  PRIMARY OSTEOARTHRITIS RIGHT KNEE    ADDITIONAL DIAGNOSIS: Active Problems:   S/P total knee replacement  Past Medical History:  Diagnosis Date  . Abnormality of gait 08/08/2015  . Anxiety   . Brain aneurysm    right ACOM  . COPD (chronic obstructive pulmonary disease) ([place])   . Depression   . Dizziness and giddiness 12/07/2013  . Dyslipidemia   . Fall   . GERD (gastroesophageal reflux disease)   . Hypertension   . Migraine without aura, without mention of intractable migraine without mention of status migrainosus 12/07/2013  . Subclavian steal syndrome     PROCEDURE: Procedure(s): RIGHT TOTAL KNEE ARTHROPLASTY on 04/21/2017  CONSULTS:    HISTORY:  See H&P in chart  HOSPITAL COURSE:  Amy Glover is a 66 y.o. admitted on 04/21/2017 and found to have a diagnosis of [place].  After appropriate laboratory studies were obtained  they were taken to the operating room on 04/21/2017 and underwent Procedure(s): RIGHT TOTAL KNEE ARTHROPLASTY.   They were given perioperative antibiotics:  Anti-infectives    Start     Dose/Rate Route Frequency Ordered Stop   04/21/17 1600  ceFAZolin (ANCEF) IVPB 1 g/50 mL premix     1 g 100 mL/hr over 30 Minutes Intravenous Every 6 hours 04/21/17 1525 04/21/17 2238   04/21/17 0900  ceFAZolin (ANCEF) IVPB 2g/100 mL premix     2 g 200 mL/hr over 30 Minutes Intravenous To ShortStay Surgical 04/18/17 1048 04/21/17 1001    .  Patient given tranexamic acid IV or topical and exparel  intra-operatively.  Tolerated the procedure well.    POD# 1: Vital signs were stable.  Patient denied Chest pain, shortness of breath, or calf pain.  Patient was started on Lovenox 30 mg subcutaneously twice daily at 8am.  Consults to PT, OT, and care management were made.  The patient was weight bearing as tolerated.  CPM was placed on the operative leg 0-90 degrees for 6-8 hours a day. When out of the CPM, patient was placed in the foam block to achieve full extension. Incentive spirometry was taught.  Dressing was changed.       POD #2, Continued  PT for ambulation and exercise program.  IV saline locked.  O2 discontinued.    The remainder of the hospital course was dedicated to ambulation and strengthening.   The patient was discharged on 1 Day Post-Op in  Good condition.  Blood products given:none  DIAGNOSTIC STUDIES: Recent vital signs: Patient Vitals for the past 24 hrs:  BP Temp Temp src Pulse Resp SpO2  04/22/17 0817 (!) 117/44 98.6 F (37 C) Oral 84 18 100 %  04/22/17 0420 (!) 104/57 98.2 F (36.8 C) Oral 84 17 99 %  04/22/17 0033 (!) 112/57 98.1 F (36.7 C) Oral 88 17 100 %  04/21/17 2038 - - - - - 98 %  04/21/17 2030 (!) 110/40 98.6 F (37 C) Oral 87 16 98 %  04/21/17 1620 100/60 97.9 F (36.6 C) Oral 85 18 100 %  04/21/17 1506 (!) 117/31 (!) 97.5 F (36.4 C) - 79 14 99 %  04/21/17 1452 (!) 109/52 - - 85 (!) 21 97 %  04/21/17 1448 (!) 99/34 - - 78 13 95 %  04/21/17 1435 (!) 102/35 - - 77 11 95 %  04/21/17 1433 (!) 102/29 - - 78 11 94 %  04/21/17 1418 (!) 104/31 - - 77 11 95 %  04/21/17 1403 (!) 102/34 - - 77 11 94 %  04/21/17 1400 - - - 71 14 94 %  04/21/17 1348 (!) 97/33 - - 76 12 94 %  04/21/17 1333 (!) 102/35 - - 75 11 93 %  04/21/17 1318 (!) 109/40 - - 73 10 95 %  04/21/17 1300 (!) 107/41 - - 71 10 98 %  04/21/17 1245 (!) 106/48 - - 72 12 98 %       Recent laboratory studies:  Recent Labs  04/22/17 0631  WBC 11.2*  HGB 10.8*  HCT 33.4*  PLT 252     Recent Labs  04/22/17 0631  NA 141  K 3.0*  CL 110  CO2 23  BUN 12  CREATININE 1.04*  GLUCOSE 128*  CALCIUM 8.7*   Lab Results  Component Value Date   INR 1.00 05/28/2016   INR 1.03 03/18/2014   INR 0.91 01/10/2012     Recent Radiographic Studies :  No results found.  DISCHARGE INSTRUCTIONS: Discharge Instructions    CPM    Complete by:  As directed    Continuous passive motion machine (CPM):      Use the CPM from 0 to 90 for 4-6 hours per day.      You may increase by 10 per day.  You may break it up into 2 or 3 sessions per day.      Use CPM for 2 weeks or until you are told to stop.   Call MD / Call 911    Complete by:  As directed    If you experience chest pain or shortness of breath, CALL 911 and be transported to the hospital emergency room.  If you develope a fever above 101 F, pus (white drainage) or increased drainage or redness at the wound, or calf pain, call your surgeon's office.   Constipation Prevention    Complete by:  As directed    Drink plenty of fluids.  Prune juice may be helpful.  You may use a stool softener, such as Colace (over the counter) 100 mg twice a day.  Use MiraLax (over the counter) for constipation as needed.   Diet - low sodium heart healthy    Complete by:  As directed    Discharge instructions    Complete by:  As directed    INSTRUCTIONS AFTER JOINT REPLACEMENT   Remove items at home which could result in a fall. This includes throw rugs or furniture in walking pathways ICE to the affected joint every three hours while awake for 30 minutes at a time, for at least the first 3-5 days, and then as needed for pain and swelling.  Continue to use ice for pain and swelling. You may notice swelling that will progress down to the foot and ankle.  This is normal after surgery.  Elevate your leg when  you are not up walking on it.   Continue to use the breathing machine you got in the hospital (incentive spirometer) which will help keep your  temperature down.  It is common for your temperature to cycle up and down following surgery, especially at night when you are not up moving around and exerting yourself.  The breathing machine keeps your lungs expanded and your temperature down.   DIET:  As you were doing prior to hospitalization, we recommend a well-balanced diet.  DRESSING / WOUND CARE / SHOWERING  Keep the surgical dressing until follow up.  The dressing is water proof, so you can shower without any extra covering.  IF THE DRESSING FALLS OFF or the wound gets wet inside, change the dressing with sterile gauze.  Please use good hand washing techniques before changing the dressing.  Do not use any lotions or creams on the incision until instructed by your surgeon.    ACTIVITY  Increase activity slowly as tolerated, but follow the weight bearing instructions below.   No driving for 6 weeks or until further direction given by your physician.  You cannot drive while taking narcotics.  No lifting or carrying greater than 10 lbs. until further directed by your surgeon. Avoid periods of inactivity such as sitting longer than an hour when not asleep. This helps prevent blood clots.  You may return to work once you are authorized by your doctor.     WEIGHT BEARING   Weight bearing as tolerated with assist device (walker, cane, etc) as directed, use it as long as suggested by your surgeon or therapist, typically at least 4-6 weeks.   EXERCISES  Results after joint replacement surgery are often greatly improved when you follow the exercise, range of motion and muscle strengthening exercises prescribed by your doctor. Safety measures are also important to protect the joint from further injury. Any time any of these exercises cause you to have increased pain or swelling, decrease what you are doing until you are comfortable again and then slowly increase them. If you have problems or questions, call your caregiver or physical  therapist for advice.   Rehabilitation is important following a joint replacement. After just a few days of immobilization, the muscles of the leg can become weakened and shrink (atrophy).  These exercises are designed to build up the tone and strength of the thigh and leg muscles and to improve motion. Often times heat used for twenty to thirty minutes before working out will loosen up your tissues and help with improving the range of motion but do not use heat for the first two weeks following surgery (sometimes heat can increase post-operative swelling).   These exercises can be done on a training (exercise) mat, on the floor, on a table or on a bed. Use whatever works the best and is most comfortable for you.    Use music or television while you are exercising so that the exercises are a pleasant break in your day. This will make your life better with the exercises acting as a break in your routine that you can look forward to.   Perform all exercises about fifteen times, three times per day or as directed.  You should exercise both the operative leg and the other leg as well.   Exercises include:   Quad Sets - Tighten up the muscle on the front of the thigh (Quad) and hold for 5-10 seconds.   Straight Leg Raises - With your knee straight (if  you were given a brace, keep it on), lift the leg to 60 degrees, hold for 3 seconds, and slowly lower the leg.  Perform this exercise against resistance later as your leg gets stronger.  Leg Slides: Lying on your back, slowly slide your foot toward your buttocks, bending your knee up off the floor (only go as far as is comfortable). Then slowly slide your foot back down until your leg is flat on the floor again.  Angel Wings: Lying on your back spread your legs to the side as far apart as you can without causing discomfort.  Hamstring Strength:  Lying on your back, push your heel against the floor with your leg straight by tightening up the muscles of your  buttocks.  Repeat, but this time bend your knee to a comfortable angle, and push your heel against the floor.  You may put a pillow under the heel to make it more comfortable if necessary.   A rehabilitation program following joint replacement surgery can speed recovery and prevent re-injury in the future due to weakened muscles. Contact your doctor or a physical therapist for more information on knee rehabilitation.    CONSTIPATION  Constipation is defined medically as fewer than three stools per week and severe constipation as less than one stool per week.  Even if you have a regular bowel pattern at home, your normal regimen is likely to be disrupted due to multiple reasons following surgery.  Combination of anesthesia, postoperative narcotics, change in appetite and fluid intake all can affect your bowels.   YOU MUST use at least one of the following options; they are listed in order of increasing strength to get the job done.  They are all available over the counter, and you may need to use some, POSSIBLY even all of these options:    Drink plenty of fluids (prune juice may be helpful) and high fiber foods Colace 100 mg by mouth twice a day  Senokot for constipation as directed and as needed Dulcolax (bisacodyl), take with full glass of water  Miralax (polyethylene glycol) once or twice a day as needed.  If you have tried all these things and are unable to have a bowel movement in the first 3-4 days after surgery call either your surgeon or your primary doctor.    If you experience loose stools or diarrhea, hold the medications until you stool forms back up.  If your symptoms do not get better within 1 week or if they get worse, check with your doctor.  If you experience "the worst abdominal pain ever" or develop nausea or vomiting, please contact the office immediately for further recommendations for treatment.   ITCHING:  If you experience itching with your medications, try taking only a  single pain pill, or even half a pain pill at a time.  You can also use Benadryl over the counter for itching or also to help with sleep.   TED HOSE STOCKINGS:  Use stockings on both legs until for at least 2 weeks or as directed by physician office. They may be removed at night for sleeping.  MEDICATIONS:  See your medication summary on the "After Visit Summary" that nursing will review with you.  You may have some home medications which will be placed on hold until you complete the course of blood thinner medication.  It is important for you to complete the blood thinner medication as prescribed.  PRECAUTIONS:  If you experience chest pain or shortness of breath -  call 911 immediately for transfer to the hospital emergency department.   If you develop a fever greater that 101 F, purulent drainage from wound, increased redness or drainage from wound, foul odor from the wound/dressing, or calf pain - CONTACT YOUR SURGEON.                                                   FOLLOW-UP APPOINTMENTS:  If you do not already have a post-op appointment, please call the office for an appointment to be seen by your surgeon.  Guidelines for how soon to be seen are listed in your "After Visit Summary", but are typically between 1-4 weeks after surgery.  OTHER INSTRUCTIONS:   Knee Replacement:  Do not place pillow under knee, focus on keeping the knee straight while resting. CPM instructions: 0-90 degrees, 2 hours in the morning, 2 hours in the afternoon, and 2 hours in the evening. Place foam block, curve side up under heel at all times except when in CPM or when walking.  DO NOT modify, tear, cut, or change the foam block in any way.  MAKE SURE YOU:  Understand these instructions.  Get help right away if you are not doing well or get worse.    Thank you for letting us be a part of your medical care team.  It is a privilege we respect greatly.  We hope these instructions will help you stay on track for a  fast and full recovery!   Increase activity slowly as tolerated    Complete by:  As directed       DISCHARGE MEDICATIONS:   Allergies as of 04/22/2017      Reactions   Estradiol Hives   Sulfa Antibiotics Hives, Nausea And Vomiting      Medication List    STOP taking these medications   ibuprofen 400 MG tablet Commonly known as:  ADVIL,MOTRIN   meloxicam 15 MG tablet Commonly known as:  MOBIC     TAKE these medications   ALPRAZolam 0.25 MG tablet Commonly known as:  XANAX Take 0.25-0.5 mg by mouth 2 (two) times daily as needed for anxiety (with meclizine for anxiety and dizziness).   aspirin 325 MG EC tablet Take 1 tablet (325 mg total) by mouth 2 (two) times daily. What changed:  medication strength  how much to take  when to take this   atorvastatin 40 MG tablet Commonly known as:  LIPITOR Take 40 mg by mouth daily.   b complex vitamins tablet Take 1 tablet by mouth daily.   budesonide-formoterol 160-4.5 MCG/ACT inhaler Commonly known as:  SYMBICORT Inhale 2 puffs into the lungs 2 (two) times daily.   CALCIUM 600+D3 PO Take 2 tablets by mouth daily.   carvedilol 6.25 MG tablet Commonly known as:  COREG Take 3.125 mg by mouth 2 (two) times daily with a meal.   FIBERCON 625 MG tablet Generic drug:  polycarbophil Take 625 mg by mouth every other day.   Fish Oil 1000 MG Caps Take 1,000 mg by mouth 2 (two) times daily.   fluticasone 50 MCG/ACT nasal spray Commonly known as:  FLONASE Place 1 spray into both nostrils daily.   furosemide 40 MG tablet Commonly known as:  LASIX Take 40 mg by mouth daily.   JUICE PLUS FIBRE PO Take 1 Dose by mouth 2 (two)  times daily.   lisinopril 10 MG tablet Commonly known as:  PRINIVIL,ZESTRIL Take 5 mg by mouth daily.   meclizine 25 MG tablet Commonly known as:  ANTIVERT Take 25 mg by mouth 3 (three) times daily as needed for dizziness. Patient takes at least one daily.   methocarbamol 500 MG tablet Commonly  known as:  ROBAXIN Take 1-2 tablets (500-1,000 mg total) by mouth every 6 (six) hours as needed for muscle spasms.   omeprazole 40 MG capsule Commonly known as:  PRILOSEC Take 40 mg by mouth daily.   Oxycodone HCl 10 MG Tabs Take 1 tablet (10 mg total) by mouth every 4 (four) hours as needed for breakthrough pain.   potassium chloride SA 20 MEQ tablet Commonly known as:  K-DUR,KLOR-CON Take 40 mEq by mouth daily.   promethazine 25 MG tablet Commonly known as:  PHENERGAN Take 25 mg by mouth every 8 (eight) hours as needed. For nausea   topiramate 100 MG tablet Commonly known as:  TOPAMAX TAKE 1 TABLET BY MOUTH TWO  TIMES DAILY   VIIBRYD 40 MG Tabs Generic drug:  Vilazodone HCl Take 40 mg by mouth daily.   Vitamin D3 5000 units Tabs Take 1 tablet by mouth daily.   zolpidem 10 MG tablet Commonly known as:  AMBIEN Take 5 mg by mouth at bedtime as needed for sleep. For sleep            Durable Medical Equipment        Start     Ordered   04/21/17 1526  DME Walker rolling  Once    Question:  Patient needs a walker to treat with the following condition  Answer:  S/P total knee replacement   04/21/17 1525   04/21/17 1526  DME 3 n 1  Once     04/21/17 1525   04/21/17 1526  DME Bedside commode  Once    Question:  Patient needs a bedside commode to treat with the following condition  Answer:  S/P total knee replacement   04/21/17 1525       Discharge Care Instructions        Start     Ordered   04/22/17 0000  aspirin EC 325 MG EC tablet  2 times daily     04/22/17 0710   04/22/17 0000  methocarbamol (ROBAXIN) 500 MG tablet  Every 6 hours PRN     04/22/17 0710   04/22/17 0000  oxyCODONE 10 MG TABS  Every 4 hours PRN     04/22/17 0710   04/22/17 0000  Call MD / Call 911    Comments:  If you experience chest pain or shortness of breath, CALL 911 and be transported to the hospital emergency room.  If you develope a fever above 101 F, pus (white drainage) or  increased drainage or redness at the wound, or calf pain, call your surgeon's office.   04/22/17 1230   04/22/17 0000  Diet - low sodium heart healthy     04/22/17 1230   04/22/17 0000  Constipation Prevention    Comments:  Drink plenty of fluids.  Prune juice may be helpful.  You may use a stool softener, such as Colace (over the counter) 100 mg twice a day.  Use MiraLax (over the counter) for constipation as needed.   04/22/17 1230   04/22/17 0000  Increase activity slowly as tolerated     04/22/17 1230   04/22/17 0000  Discharge instructions  Comments:  INSTRUCTIONS AFTER JOINT REPLACEMENT   Remove items at home which could result in a fall. This includes throw rugs or furniture in walking pathways ICE to the affected joint every three hours while awake for 30 minutes at a time, for at least the first 3-5 days, and then as needed for pain and swelling.  Continue to use ice for pain and swelling. You may notice swelling that will progress down to the foot and ankle.  This is normal after surgery.  Elevate your leg when you are not up walking on it.   Continue to use the breathing machine you got in the hospital (incentive spirometer) which will help keep your temperature down.  It is common for your temperature to cycle up and down following surgery, especially at night when you are not up moving around and exerting yourself.  The breathing machine keeps your lungs expanded and your temperature down.   DIET:  As you were doing prior to hospitalization, we recommend a well-balanced diet.  DRESSING / WOUND CARE / SHOWERING  Keep the surgical dressing until follow up.  The dressing is water proof, so you can shower without any extra covering.  IF THE DRESSING FALLS OFF or the wound gets wet inside, change the dressing with sterile gauze.  Please use good hand washing techniques before changing the dressing.  Do not use any lotions or creams on the incision until instructed by your surgeon.     ACTIVITY  Increase activity slowly as tolerated, but follow the weight bearing instructions below.   No driving for 6 weeks or until further direction given by your physician.  You cannot drive while taking narcotics.  No lifting or carrying greater than 10 lbs. until further directed by your surgeon. Avoid periods of inactivity such as sitting longer than an hour when not asleep. This helps prevent blood clots.  You may return to work once you are authorized by your doctor.     WEIGHT BEARING   Weight bearing as tolerated with assist device (walker, cane, etc) as directed, use it as long as suggested by your surgeon or therapist, typically at least 4-6 weeks.   EXERCISES  Results after joint replacement surgery are often greatly improved when you follow the exercise, range of motion and muscle strengthening exercises prescribed by your doctor. Safety measures are also important to protect the joint from further injury. Any time any of these exercises cause you to have increased pain or swelling, decrease what you are doing until you are comfortable again and then slowly increase them. If you have problems or questions, call your caregiver or physical therapist for advice.   Rehabilitation is important following a joint replacement. After just a few days of immobilization, the muscles of the leg can become weakened and shrink (atrophy).  These exercises are designed to build up the tone and strength of the thigh and leg muscles and to improve motion. Often times heat used for twenty to thirty minutes before working out will loosen up your tissues and help with improving the range of motion but do not use heat for the first two weeks following surgery (sometimes heat can increase post-operative swelling).   These exercises can be done on a training (exercise) mat, on the floor, on a table or on a bed. Use whatever works the best and is most comfortable for you.    Use music or television  while you are exercising so that the exercises are a pleasant  break in your day. This will make your life better with the exercises acting as a break in your routine that you can look forward to.   Perform all exercises about fifteen times, three times per day or as directed.  You should exercise both the operative leg and the other leg as well.   Exercises include:   Quad Sets - Tighten up the muscle on the front of the thigh (Quad) and hold for 5-10 seconds.   Straight Leg Raises - With your knee straight (if you were given a brace, keep it on), lift the leg to 60 degrees, hold for 3 seconds, and slowly lower the leg.  Perform this exercise against resistance later as your leg gets stronger.  Leg Slides: Lying on your back, slowly slide your foot toward your buttocks, bending your knee up off the floor (only go as far as is comfortable). Then slowly slide your foot back down until your leg is flat on the floor again.  Angel Wings: Lying on your back spread your legs to the side as far apart as you can without causing discomfort.  Hamstring Strength:  Lying on your back, push your heel against the floor with your leg straight by tightening up the muscles of your buttocks.  Repeat, but this time bend your knee to a comfortable angle, and push your heel against the floor.  You may put a pillow under the heel to make it more comfortable if necessary.   A rehabilitation program following joint replacement surgery can speed recovery and prevent re-injury in the future due to weakened muscles. Contact your doctor or a physical therapist for more information on knee rehabilitation.    CONSTIPATION  Constipation is defined medically as fewer than three stools per week and severe constipation as less than one stool per week.  Even if you have a regular bowel pattern at home, your normal regimen is likely to be disrupted due to multiple reasons following surgery.  Combination of anesthesia, postoperative  narcotics, change in appetite and fluid intake all can affect your bowels.   YOU MUST use at least one of the following options; they are listed in order of increasing strength to get the job done.  They are all available over the counter, and you may need to use some, POSSIBLY even all of these options:    Drink plenty of fluids (prune juice may be helpful) and high fiber foods Colace 100 mg by mouth twice a day  Senokot for constipation as directed and as needed Dulcolax (bisacodyl), take with full glass of water  Miralax (polyethylene glycol) once or twice a day as needed.  If you have tried all these things and are unable to have a bowel movement in the first 3-4 days after surgery call either your surgeon or your primary doctor.    If you experience loose stools or diarrhea, hold the medications until you stool forms back up.  If your symptoms do not get better within 1 week or if they get worse, check with your doctor.  If you experience "the worst abdominal pain ever" or develop nausea or vomiting, please contact the office immediately for further recommendations for treatment.   ITCHING:  If you experience itching with your medications, try taking only a single pain pill, or even half a pain pill at a time.  You can also use Benadryl over the counter for itching or also to help with sleep.   TED HOSE STOCKINGS:  Use  stockings on both legs until for at least 2 weeks or as directed by physician office. They may be removed at night for sleeping.  MEDICATIONS:  See your medication summary on the "After Visit Summary" that nursing will review with you.  You may have some home medications which will be placed on hold until you complete the course of blood thinner medication.  It is important for you to complete the blood thinner medication as prescribed.  PRECAUTIONS:  If you experience chest pain or shortness of breath - call 911 immediately for transfer to the hospital emergency department.    If you develop a fever greater that 101 F, purulent drainage from wound, increased redness or drainage from wound, foul odor from the wound/dressing, or calf pain - CONTACT YOUR SURGEON.                                                   FOLLOW-UP APPOINTMENTS:  If you do not already have a post-op appointment, please call the office for an appointment to be seen by your surgeon.  Guidelines for how soon to be seen are listed in your "After Visit Summary", but are typically between 1-4 weeks after surgery.  OTHER INSTRUCTIONS:   Knee Replacement:  Do not place pillow under knee, focus on keeping the knee straight while resting. CPM instructions: 0-90 degrees, 2 hours in the morning, 2 hours in the afternoon, and 2 hours in the evening. Place foam block, curve side up under heel at all times except when in CPM or when walking.  DO NOT modify, tear, cut, or change the foam block in any way.  MAKE SURE YOU:  Understand these instructions.  Get help right away if you are not doing well or get worse.    Thank you for letting us be a part of your medical care team.  It is a privilege we respect greatly.  We hope these instructions will help you stay on track for a fast and full recovery!   04/22/17 1230   04/22/17 0000  CPM    Comments:  Continuous passive motion machine (CPM):      Use the CPM from 0 to 90 for 4-6 hours per day.      You may increase by 10 per day.  You may break it up into 2 or 3 sessions per day.      Use CPM for 2 weeks or until you are told to stop.   04/22/17 1230      FOLLOW UP VISIT:    DISPOSITION: HOME VS. SNF  CONDITION:  Good   Donia Ast 04/22/2017, 12:30 PM

## 2017-04-22 NOTE — Evaluation (Signed)
Occupational Therapy Evaluation Patient Details Name: Amy Glover MRN: [id number] DOB: [birth year] Today's Date: 04/22/2017    History of Present Illness Pt is a 66 y/o female s/p R TKA. PMH includes COPD, anxiety, abnormality of gait, falls, HTN, migraine, subclavian steal syndrome, and aneurysm s/p repair.    Clinical Impression   Pt is s/p R TKA.  PTA, pt reports history of falls and right wrist injuries. Upon eval, pt able to complete ADLs with min guard-min assist with caregiver (husband) independent in assisting.  Reports family will be able to provide 24/7 assist. Recommending 3n1 for discharge home.  Therapist completed education with patient and caregiver. Will discharge from acute OT services.    Follow Up Recommendations  DC plan and follow up therapy as arranged by surgeon;Supervision/Assistance - 24 hour    Equipment Recommendations  3 in 1 bedside commode    Recommendations for Other Services       Precautions / Restrictions Restrictions Weight Bearing Restrictions: Yes RLE Weight Bearing: Weight bearing as tolerated      Mobility Bed Mobility Overal bed mobility: Needs Assistance Bed Mobility: Supine to Sit;Sit to Supine     Supine to sit: Supervision Sit to supine: Supervision      Transfers Overall transfer level: Needs assistance Equipment used: Rolling walker (2 wheeled) Transfers: Sit to/from Stand Sit to Stand: Min guard              Balance   Sitting-balance support: No upper extremity supported;Feet supported Sitting balance-Leahy Scale: Good     Standing balance support: Bilateral upper extremity supported;During functional activity Standing balance-Leahy Scale: Poor                             ADL either performed or assessed with clinical judgement   ADL Overall ADL's : Needs assistance/impaired Eating/Feeding: Set up;Sitting   Grooming: Brushing hair;Wash/dry face;Oral care;Min guard;Standing   Upper Body  Bathing: Supervision/ safety;Set up;Sitting   Lower Body Bathing: Minimal assistance;Sit to/from stand   Upper Body Dressing : Set up;Supervision/safety;Sitting   Lower Body Dressing: Minimal assistance;Sit to/from stand   Toilet Transfer: Min IT trainer Details (indicate cue type and reason): cues for safety and hand placement Toileting- Clothing Manipulation and Hygiene: Min guard;Sit to/from stand       Functional mobility during ADLs: Min guard;Rolling walker;Cueing for sequencing;Cueing for safety General ADL Comments: Patient ambulated to bathroom to complete toileting and grooming ADLs.  Patient's caregiver independent in assisting patient with mobility and ADLs.  Educated patient and caregiver on self care strategies for bathing and dressing.     Vision Baseline Vision/History: Wears glasses Patient Visual Report: Blurring of vision (improving during session)       Perception     Praxis      Pertinent Vitals/Pain Pain Assessment: 0-10 Pain Score: 4  Pain Location: R knee  Pain Descriptors / Indicators: Aching;Operative site guarding Pain Intervention(s): Monitored during session     Hand Dominance Right   Extremity/Trunk Assessment Upper Extremity Assessment Upper Extremity Assessment: Overall WFL for tasks assessed   Lower Extremity Assessment Lower Extremity Assessment: Defer to PT evaluation       Communication Communication Communication: No difficulties   Cognition Arousal/Alertness: Awake/alert Behavior During Therapy: WFL for tasks assessed/performed Overall Cognitive Status: History of cognitive impairments - at baseline  General Comments: Reports aneurysm and had repair which 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    General Comments       Exercises     Shoulder Instructions      Home Living Family/patient expects to be discharged to:: Private residence Living  Arrangements: Spouse/significant other Available Help at Discharge: Family;Available 24 hours/day Type of Home: House Home Access: Ramped entrance     Home Layout: One level     Bathroom Shower/Tub: Occupational psychologist:  (comfort height)     Home Equipment: Shower seat          Prior Functioning/Environment Level of Independence: Independent with assistive device(s)        Comments: Used RW for mobility         OT Problem List: Decreased strength;Decreased activity tolerance;Impaired balance (sitting and/or standing);Pain;Decreased knowledge of use of DME or AE      OT Treatment/Interventions:      OT Goals(Current goals can be found in the care plan section) Acute Rehab OT Goals Patient Stated Goal: to go home   OT Frequency:     Barriers to D/C:            Co-evaluation              AM-PAC PT "6 Clicks" Daily Activity     Outcome Measure Help from another person eating meals?: None Help from another person taking care of personal grooming?: A Little Help from another person toileting, which includes using toliet, bedpan, or urinal?: A Little Help from another person bathing (including washing, rinsing, drying)?: A Little Help from another person to put on and taking off regular upper body clothing?: A Little Help from another person to put on and taking off regular lower body clothing?: A Little 6 Click Score: 19   End of Session Equipment Utilized During Treatment: Gait belt;Rolling walker  Activity Tolerance: Patient tolerated treatment well Patient left: in bed;with call bell/phone within reach;with family/visitor present  OT Visit Diagnosis: Unsteadiness on feet (R26.81);Pain Pain - Right/Left: Right Pain - part of body: Knee                Time: 0814-4818 OT Time Calculation (min): 28 min Charges:  OT General Charges $OT Visit: 1 Visit OT Evaluation $OT Eval Low Complexity: 1 Low OT Treatments $Self Care/Home Management :  8-22 mins G-Codes: OT G-codes **NOT FOR INPATIENT CLASS** Functional Assessment Tool Used: AM-PAC 6 Clicks Daily Activity Functional Limitation: Self care Self Care Current Status (H6314): At least 40 percent but less than 60 percent impaired, limited or restricted Self Care Goal Status (H7026): At least 40 percent but less than 60 percent impaired, limited or restricted Self Care Discharge Status [phone]): At least 40 percent but less than 60 percent impaired, limited or restricted    Darrol Jump OTR/L 04/22/2017, 9:44 AM

## 2017-04-22 NOTE — Progress Notes (Signed)
Physical Therapy Treatment Patient Details Name: Amy Glover MRN: [id number] DOB: 1951-08-12 Today's Date: 04/22/2017    History of Present Illness Pt is a 66 y/o female s/p R TKA. PMH includes COPD, anxiety, abnormality of gait, falls, HTN, migraine, subclavian steal syndrome, and aneurysm s/p repair.     PT Comments    Patient demonstrated improved ROM and able to transfer with min guard/supervision. Pt unsteady with gait and required assist for balance. Limited by feeling of nausea and faint which pt reports is normal for her about once a week. Husband present and assisted with mobility. Current plan remains appropriate as pt has balance deficits at baseline and husband is able to provide assistance. Pt will benefit from having w/c for use when going longer distances due to "episodes" of feeling as she will pass out and history of recurrent falls.    Follow Up Recommendations  DC plan and follow up therapy as arranged by surgeon;Supervision for mobility/OOB     Equipment Recommendations  Rolling walker with 5" wheels;3in1 (PT);Wheelchair (measurements PT);Wheelchair cushion (measurements PT)    Recommendations for Other Services       Precautions / Restrictions Precautions Precautions: Knee;Fall Precaution Comments: reviewed positioning with pt and husband Restrictions Weight Bearing Restrictions: Yes RLE Weight Bearing: Weight bearing as tolerated    Mobility  Bed Mobility Overal bed mobility: Needs Assistance Bed Mobility: Supine to Sit;Sit to Supine     Supine to sit: Supervision Sit to supine: Supervision      Transfers Overall transfer level: Needs assistance Equipment used: Rolling walker (2 wheeled) Transfers: Sit to/from Stand Sit to Stand: Min guard         General transfer comment: cues for safe hand placement and min guard for safety  Ambulation/Gait Ambulation/Gait assistance: Min assist Ambulation Distance (Feet):  (168ft total with seated  break) Assistive device: Rolling walker (2 wheeled) Gait Pattern/deviations: Step-through pattern;Decreased stance time - right;Decreased step length - left;Decreased step length - right;Decreased weight shift to right;Ataxic Gait velocity: Decreased   General Gait Details: cues for sequencing and R heel strike; pt with mildly ataxic gait at times and limited by "episode" of feeling nauseous and faint which pt reported is normal and happens ~once a week; pt returned to room in chair; pt required assist for balance    Stairs            Wheelchair Mobility    Modified Rankin (Stroke Patients Only)       Balance   Sitting-balance support: No upper extremity supported;Feet supported Sitting balance-Leahy Scale: Good     Standing balance support: Bilateral upper extremity supported;During functional activity Standing balance-Leahy Scale: Poor                              Cognition Arousal/Alertness: Awake/alert Behavior During Therapy: WFL for tasks assessed/performed Overall Cognitive Status: History of cognitive impairments - at baseline                                 General Comments: Reports aneurysm and had repair which 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       Exercises      General Comments General comments (skin integrity, edema, etc.): husband present; end of session BP 103/45 (60) in sitting      Pertinent Vitals/Pain Pain Assessment: Faces Pain Score: 4  Faces Pain Scale: Hurts little  more Pain Location: R knee  Pain Descriptors / Indicators: Guarding;Sore Pain Intervention(s): Limited activity within patient's tolerance;Monitored during session;Premedicated before session;Repositioned    Home Living Family/patient expects to be discharged to:: Private residence Living Arrangements: Spouse/significant other Available Help at Discharge: Family;Available 24 hours/day Type of Home: House Home Access: Ramped entrance   Home  Layout: One level Home Equipment: Shower seat      Prior Function Level of Independence: Independent with assistive device(s)      Comments: Used RW for mobility    PT Goals (current goals can now be found in the care plan section) Acute Rehab PT Goals Patient Stated Goal: to go home  Progress towards PT goals: Progressing toward goals    Frequency    7X/week      PT Plan Current plan remains appropriate    Co-evaluation              AM-PAC PT "6 Clicks" Daily Activity  Outcome Measure  Difficulty turning over in bed (including adjusting bedclothes, sheets and blankets)?: None Difficulty moving from lying on back to sitting on the side of the bed? : A Lot Difficulty sitting down on and standing up from a chair with arms (e.g., wheelchair, bedside commode, etc,.)?: Unable Help needed moving to and from a bed to chair (including a wheelchair)?: A Little Help needed walking in hospital room?: A Lot Help needed climbing 3-5 steps with a railing? : A Lot 6 Click Score: 14    End of Session Equipment Utilized During Treatment: Gait belt Activity Tolerance: Patient tolerated treatment well;Treatment limited secondary to medical complications (Comment) Patient left: in bed;with call bell/phone within reach;with family/visitor present Nurse Communication: Mobility status PT Visit Diagnosis: Unsteadiness on feet (R26.81);History of falling (Z91.81);Other abnormalities of gait and mobility (R26.89);Pain Pain - Right/Left: Right Pain - part of body: Knee     Time: 0930-1000 PT Time Calculation (min) (ACUTE ONLY): 30 min  Charges:  $Gait Training: 8-22 mins $Therapeutic Activity: 8-22 mins                    G Codes:       Earney Navy, PTA Pager: [phone]     Darliss Cheney 04/22/2017, 11:21 AM

## 2017-04-22 NOTE — Progress Notes (Signed)
SPORTS MEDICINE AND JOINT REPLACEMENT  Lara Mulch, MD    Carlyon Shadow, PA-C [address], [place], [place]  [address]                             [phone]   PROGRESS NOTE  Subjective:  negative for Chest Pain  negative for Shortness of Breath  negative for Nausea/Vomiting   negative for Calf Pain  negative for Bowel Movement   Tolerating Diet: yes         Patient reports pain as 3 on 0-10 scale.    Objective: Vital signs in last 24 hours:   Patient Vitals for the past 24 hrs:  BP Temp Temp src Pulse Resp SpO2 Weight  04/22/17 0420 (!) 104/57 98.2 F (36.8 C) Oral 84 17 99 % -  04/22/17 0033 (!) 112/57 98.1 F (36.7 C) Oral 88 17 100 % -  04/21/17 2038 - - - - - 98 % -  04/21/17 2030 (!) 110/40 98.6 F (37 C) Oral 87 16 98 % -  04/21/17 1620 100/60 97.9 F (36.6 C) Oral 85 18 100 % -  04/21/17 1506 (!) 117/31 (!) 97.5 F (36.4 C) - 79 14 99 % -  04/21/17 1452 (!) 109/52 - - 85 (!) 21 97 % -  04/21/17 1448 (!) 99/34 - - 78 13 95 % -  04/21/17 1435 (!) 102/35 - - 77 11 95 % -  04/21/17 1433 (!) 102/29 - - 78 11 94 % -  04/21/17 1418 (!) 104/31 - - 77 11 95 % -  04/21/17 1403 (!) 102/34 - - 77 11 94 % -  04/21/17 1400 - - - 71 14 94 % -  04/21/17 1348 (!) 97/33 - - 76 12 94 % -  04/21/17 1333 (!) 102/35 - - 75 11 93 % -  04/21/17 1318 (!) 109/40 - - 73 10 95 % -  04/21/17 1300 (!) 107/41 - - 71 10 98 % -  04/21/17 1245 (!) 106/48 - - 72 12 98 % -  04/21/17 1230 (!) 93/46 - - - - - -  04/21/17 1215 (!) 103/45 - - 74 (!) 8 99 % -  04/21/17 1203 (!) 93/57 - - 73 15 99 % -  04/21/17 1201 101/60 - - 73 15 98 % -  04/21/17 1148 95/68 (!) 97.4 F (36.3 C) - 73 13 100 % -  04/21/17 0829 (!) 116/58 - - - - - -  04/21/17 0815 - (!) 97.5 F (36.4 C) Oral 75 20 100 % -  04/21/17 0803 - - - - - - 74.7 kg (164 lb 11.2 oz)    @flow {1959:LAST@   Intake/Output from previous day:   08/27 0701 - 08/28 0700 In: 2340 [P.O.:120; I.V.:1950] Out: 75 [Urine:50]    Intake/Output this shift:   No intake/output data recorded.   Intake/Output      08/27 0701 - 08/28 0700 08/28 0701 - 08/29 0700   P.O. 120    I.V. (mL/kg) 1950 (26.1)    IV Piggyback 270    Total Intake(mL/kg) 2340 (31.3)    Urine (mL/kg/hr) 50    Blood 25    Total Output 75     Net +2265          Urine Occurrence 2 x       LABORATORY DATA: No results for input(s): WBC, HGB, HCT, PLT in the last  168 hours. No results for input(s): NA, K, CL, CO2, BUN, CREATININE, GLUCOSE, CALCIUM in the last 168 hours. Lab Results  Component Value Date   INR 1.00 05/28/2016   INR 1.03 03/18/2014   INR 0.91 01/10/2012    Examination:  General appearance: alert, cooperative and no distress Extremities: extremities normal, atraumatic, no cyanosis or edema  Wound Exam: clean, dry, intact   Drainage:  None: wound tissue dry  Motor Exam: Quadriceps and Hamstrings Intact  Sensory Exam: Superficial Peroneal, Deep Peroneal and Tibial normal   Assessment:    1 Day Post-Op  Procedure(s) (LRB): RIGHT TOTAL KNEE ARTHROPLASTY (Right)  ADDITIONAL DIAGNOSIS:  Active Problems:   S/P total knee replacement     Plan: Physical Therapy as ordered Weight Bearing as Tolerated (WBAT)  DVT Prophylaxis:  Aspirin  DISCHARGE PLAN: Home  DISCHARGE NEEDS: HHPT   Patient doing well, expected D/C home today         Donia Ast 04/22/2017, 7:07 AM

## 2017-04-22 NOTE — Op Note (Signed)
TOTAL KNEE REPLACEMENT OPERATIVE NOTE:  04/21/2017  2:37 PM  PATIENT:  Amy Glover  66 y.o. female  PRE-OPERATIVE DIAGNOSIS:  PRIMARY OSTEOARTHRITIS RIGHT KNEE  POST-OPERATIVE DIAGNOSIS:  PRIMARY OSTEOARTHRITIS RIGHT KNEE  PROCEDURE:  Procedure(s): RIGHT TOTAL KNEE ARTHROPLASTY  SURGEON:  Surgeon(s): Vickey Huger, MD  PHYSICIAN ASSISTANT: Carlyon Shadow, [facility]   ANESTHESIA:   spinal  DRAINS: Hemovac  SPECIMEN: None  COUNTS:  Correct  TOURNIQUET:   Total Tourniquet Time Documented: Thigh (Right) - 40 minutes Total: Thigh (Right) - 40 minutes   DICTATION:  Indication for procedure:    The patient is a 66 y.o. female who has failed conservative treatment for PRIMARY OSTEOARTHRITIS RIGHT KNEE.  Informed consent was obtained prior to anesthesia. The risks versus benefits of the operation were explain and in a way the patient can, and did, understand.   On the implant demand matching protocol, this patient scored 10.  Therefore, this patient was not receive a polyethylene insert with vitamin E which is a high demand implant.  Description of procedure:     The patient was taken to the operating room and placed under anesthesia.  The patient was positioned in the usual fashion taking care that all body parts were adequately padded and/or protected.  I foley catheter was not placed.  A tourniquet was applied and the leg prepped and draped in the usual sterile fashion.  The extremity was exsanguinated with the esmarch and tourniquet inflated to 350 mmHg.  Pre-operative range of motion was normal.  The knee was in 5 degree of mild valgus.  A midline incision approximately 6-7 inches long was made with a #10 blade.  A new blade was used to make a parapatellar arthrotomy going 2-3 cm into the quadriceps tendon, over the patella, and alongside the medial aspect of the patellar tendon.  A synovectomy was then performed with the #10 blade and forceps. I then elevated the deep MCL off  the medial tibial metaphysis subperiosteally around to the semimembranosus attachment.    I everted the patella and used calipers to measure patellar thickness.  I used the reamer to ream down to appropriate thickness to recreate the native thickness.  I then removed excess bone with the rongeur and sagittal saw.  I used the appropriately sized template and drilled the three lug holes.  I then put the trial in place and measured the thickness with the calipers to ensure recreation of the native thickness.  The trial was then removed and the patella subluxed and the knee brought into flexion.  A homan retractor was place to retract and protect the patella and lateral structures.  A Z-retractor was place medially to protect the medial structures.  The extra-medullary alignment system was used to make cut the tibial articular surface perpendicular to the anamotic axis of the tibia and in 3 degrees of posterior slope.  The cut surface and alignment jig was removed.  I then used the intramedullary alignment guide to make a 4 valgus cut on the distal femur.  I then marked out the epicondylar axis on the distal femur.  The posterior condylar axis measured 4 degrees.  I then used the anterior referencing sizer and measured the femur to be a size 5.  The 4-In-1 cutting block was screwed into place in external rotation matching the posterior condylar angle, making our cuts perpendicular to the epicondylar axis.  Anterior, posterior and chamfer cuts were made with the sagittal saw.  The cutting block and cut  pieces were removed.  A lamina spreader was placed in 90 degrees of flexion.  The ACL, PCL, menisci, and posterior condylar osteophytes were removed.  A 13 mm spacer blocked was found to offer good flexion and extension gap balance after moderate in degree releasing.   The scoop retractor was then placed and the femoral finishing block was pinned in place.  The small sagittal saw was used as well as the lug drill  to finish the femur.  The block and cut surfaces were removed and the medullary canal hole filled with autograft bone from the cut pieces.  The tibia was delivered forward in deep flexion and external rotation.  A size D tray was selected and pinned into place centered on the medial 1/3 of the tibial tubercle.  The reamer and keel was used to prepare the tibia through the tray.    I then trialed with the size 5 femur, size D tibia, a 13 mm insert and the 32 patella.  I had excellent flexion/extension gap balance, excellent patella tracking.  Flexion was full and beyond 120 degrees; extension was zero.  These components were chosen and the staff opened them to me on the back table while the knee was lavaged copiously and the cement mixed.  The soft tissue was infiltrated with 60cc of exparel 1.3% through a 21 gauge needle.  I cemented in the components and removed all excess cement.  The polyethylene tibial component was snapped into place and the knee placed in extension while cement was hardening.  The capsule was infilltrated with 30cc of .25% Marcaine with epinephrine.  A hemovac was place in the joint exiting superolaterally.  A pain pump was place superomedially superficial to the arthrotomy.  Once the cement was hard, the tourniquet was let down.  Hemostasis was obtained.  The arthrotomy was closed with figure-8 #1 vicryl sutures.  The deep soft tissues were closed with #0 vicryls and the subcuticular layer closed with a running #2-0 vicryl.  The skin was reapproximated and closed with skin staples.  The wound was dressed with xeroform, 4 x4's, 2 ABD sponges, a single layer of webril and a TED stocking.   The patient was then awakened, extubated, and taken to the recovery room in stable condition.  BLOOD LOSS:  300cc DRAINS: 1 hemovac, 1 pain catheter COMPLICATIONS:  None.  PLAN OF CARE: Admit to inpatient   PATIENT DISPOSITION:  PACU - hemodynamically stable.   Delay start of Pharmacological  VTE agent (>24hrs) due to surgical blood loss or risk of bleeding:  not applicable  Please fax a copy of this op note to my office at [phone] (please only include page 1 and 2 of the Case Information op note)

## 2017-04-22 NOTE — Care Management Note (Signed)
Case Management Note  Patient Details  Name: Amy Glover MRN: [id number] Date of Birth: 28-Oct-1950  Subjective/Objective:    66 yr old female s/p right total knee arthroplasty.                Action/Plan: Case manager spoke with patient and her husband concerning discharge plans and DME. Patient was preoperatively setup with [facility], no changes. CPM has been delivered to her home, Case manager has requested DME from [facility]. She will have family support at discharge.   Expected Discharge Date:  04/22/17               Expected Discharge Plan:  [place]  In-House Referral:  NA  Discharge planning Services  CM Consult  Post Acute Care Choice:  Durable Medical Equipment, Home Health Choice offered to:  Patient  DME Arranged:  3-N-1, Walker rolling, CPM DME Agency:  [place] [employer]  HH Arranged:  PT HH Agency:  [facility] (formerly [facility])  Status of Service:  Completed, signed off  If discussed at [employer], dates discussed:    Additional Comments:  Ninfa Meeker, RN 04/22/2017, 1:15 PM

## 2017-04-22 NOTE — Plan of Care (Signed)
Problem: Education: Goal: Knowledge of [facility] Education information/materials will improve Outcome: Progressing POC reviewed with pt.   

## 2017-08-04 ENCOUNTER — Telehealth: Payer: Self-pay | Admitting: *Deleted
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
## 2017-08-05 ENCOUNTER — Ambulatory Visit: Payer: Self-pay | Admitting: Neurology

## 2017-08-11 ENCOUNTER — Ambulatory Visit
Admission: RE | Admit: 2017-08-11 | Discharge: 2017-08-11 | Disposition: A | Discharge status: Home / Self Care | Payer: Medicare Other | Source: Ambulatory Visit | Attending: Physical Medicine and Rehabilitation | Admitting: Physical Medicine and Rehabilitation

## 2017-08-11 ENCOUNTER — Other Ambulatory Visit: Payer: Self-pay | Admitting: Physical Medicine and Rehabilitation

## 2017-08-11 DIAGNOSIS — W19XXXA Unspecified fall, initial encounter: Secondary | ICD-10-CM

## 2017-08-13 ENCOUNTER — Ambulatory Visit (INDEPENDENT_AMBULATORY_CARE_PROVIDER_SITE_OTHER): Payer: Medicare Other | Admitting: Cardiology

## 2017-08-13 ENCOUNTER — Encounter: Payer: Self-pay | Admitting: Cardiology

## 2017-08-13 VITALS — BP 98/66 | HR 87 | Resp 16 | Ht 62.5 in | Wt 150.6 lb

## 2017-08-13 DIAGNOSIS — E785 Hyperlipidemia, unspecified: Secondary | ICD-10-CM | POA: Diagnosis not present

## 2017-08-13 DIAGNOSIS — R296 Repeated falls: Secondary | ICD-10-CM

## 2017-08-13 DIAGNOSIS — I10 Essential (primary) hypertension: Secondary | ICD-10-CM | POA: Diagnosis not present

## 2017-08-13 DIAGNOSIS — I428 Other cardiomyopathies: Secondary | ICD-10-CM | POA: Diagnosis not present

## 2017-08-13 MED ORDER — FUROSEMIDE 40 MG PO TABS: 40.0000 mg | ORAL_TABLET | Freq: Every day | ORAL | 6 refills | Status: DC | PRN
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
## 2017-08-13 NOTE — Patient Instructions (Signed)
Medication Instructions:   DECREASE YOUR LASIX TO 40 MG BY MOUTH DAILY AS NEEDED FOR LOWER EXTREMITY SWELLING     Follow-Up:  Your physician wants you to follow-up in: 6 MONTHS WITH DR NELSON You will receive a reminder letter in the mail two months in advance. If you don't receive a letter, please call our office to schedule the follow-up appointment.        If you need a refill on your cardiac medications before your next appointment, please call your pharmacy.   

## 2017-09-03 ENCOUNTER — Telehealth: Payer: Self-pay | Admitting: Cardiology

## 2017-09-03 NOTE — Telephone Encounter (Signed)
Request for surgical clearance:  1. What type of surgery is being performed?  Bilateral Facet Inj L3-4, L4-5, L5-S1   2. When is this surgery scheduled?  09/22/17   3. Are there any medications that need to be held prior to surgery and how long? ASA x 7 days   4. Name of physician performing surgery? Dr. Corinna Capra   5. What is your office phone and fax number?  Phone [phone]  Fax- [phone]

## 2017-09-04 NOTE — Telephone Encounter (Signed)
I attempted to call the patient, and left message for her to call us back for clearance. She has a h/o NICM, therefore unless she is complaining of chest pain, should be able to temporarily hold aspirin for 7 days prior to lumbar injection.   Hilbert Corrigan PA Pager: [phone]

## 2017-09-10 NOTE — Telephone Encounter (Signed)
   Primary Cardiologist: No primary care provider on file.  Chart reviewed as part of pre-operative protocol coverage. Given past medical history and time since last visit, based on ACC/AHA guidelines, Amy Glover would be at acceptable risk for the planned procedure without further cardiovascular testing. Ok to hold ASA for 7 days.   I will route this recommendation to the requesting party via Epic fax function and remove from pre-op pool.  Please call with questions.  [place], [place] 09/10/2017, 1:43 PM

## 2017-11-27 ENCOUNTER — Telehealth: Payer: Self-pay

## 2017-11-27 NOTE — Telephone Encounter (Signed)
  It has been more than 2 months since she has been seen.  I left a message on her home phone and cell phone asking her to call back so that we may evaluate her over the phone and clear her for surgery.  Rosaria Ferries, PA-C 11/27/2017 4:45 PM [place] [phone]

## 2017-11-27 NOTE — Telephone Encounter (Signed)
   [facility] Pre-operative Risk Assessment    Request for surgical clearance:  1. What type of surgery is being performed? Bilateral Facet Injections   2. When is this surgery scheduled? 12/10/17   3. What type of clearance is required (medical clearance vs. Pharmacy clearance to hold med vs. Both)? BOTH  4. Are there any medications that need to be held prior to surgery and how long? ASA, hold 7 days prior to the procedure   5. Practice name and name of physician performing surgery? [facility]- Dr. Elta Guadeloupe Phillips/Dr. Corinna Capra   6. What is your office phone and fax number? Phone [phone] Fax [phone]   7. Anesthesia type (None, local, MAC, general) ? unknown   [name] 11/27/2017, 4:26 PM  _________________________________________________________________   (provider comments below)

## 2017-11-28 ENCOUNTER — Telehealth: Payer: Self-pay

## 2017-11-28 NOTE — Telephone Encounter (Signed)
   Primary Cardiologist: Ena Dawley, MD  Chart reviewed as part of pre-operative protocol coverage. Patient was contacted 11/28/2017 in reference to pre-operative risk assessment for pending surgery as outlined below.  ANJELITA SHEAHAN was last seen on 08/13/2017 by Dr Meda Coffee.  Since that day, DVORA BUITRON has done well.  She is not having any cardiac symptoms, can walk several blocks on flat ground.  She does get short of breath climbing hills.  She is not having lower extremity edema, no orthopnea or PND.  Her weight is stable.  She takes Lasix as needed for volume management but rarely has to take it.  No chest pain.  Therefore, based on ACC/AHA guidelines, the patient would be at acceptable risk for the planned procedure without further cardiovascular testing.   Okay to hold the aspirin for 7 days prior to the procedure.  I will route this recommendation to the requesting party via Epic fax function and remove from pre-op pool.  Please call with questions.  Rosaria Ferries, PA-C 11/28/2017, 2:12 PM

## 2017-11-28 NOTE — Telephone Encounter (Signed)
Follow up    Patient is returning call to [facility]

## 2017-11-28 NOTE — Telephone Encounter (Signed)
Clearance faxed to [facility] office via faxed machine

## 2017-11-28 NOTE — Telephone Encounter (Signed)
   [facility] Pre-operative Risk Assessment    Request for surgical clearance:  1. What type of surgery is being performed? Bilateral facet injections   2. When is this surgery scheduled? 12/10/17   3. What type of clearance is required (medical clearance vs. Pharmacy clearance to hold med vs. Both)? pharmacy  4. Are there any medications that need to be held prior to surgery and how long?aspirin 7 days   5. Practice name and name of physician performing surgery? [facility]/Dr Nicholaus Bloom   6. What is your office phone and fax number? [phone]  [phone]   7. Anesthesia type (None, local, MAC, general) ? none   Amy Glover 11/28/2017, 11:17 AM  _________________________________________________________________   (provider comments below)

## 2017-12-01 NOTE — Telephone Encounter (Signed)
   Primary Cardiologist: Ena Dawley, MD  Chart reviewed as part of pre-operative protocol coverage. Patient was contacted 12/01/2017 in reference to pre-operative risk assessment for pending surgery as outlined below.  Amy Glover was last seen on 08/13/2017  by Dr. Meda Coffee.  Since that day, Amy Glover has done well. She is without cardiac issues.   Therefore, based on ACC/AHA guidelines, the patient would be at acceptable risk for the planned procedure without further cardiovascular testing.   I will route this recommendation to the requesting party via Epic fax function and remove from pre-op pool.  Please call with questions.  Jory Sims DNP, ANP, AACC  12/01/2017, 4:44 PM

## 2017-12-02 NOTE — Telephone Encounter (Signed)
Forwarded to requesting providers office via EPIC

## 2017-12-18 ENCOUNTER — Other Ambulatory Visit (HOSPITAL_BASED_OUTPATIENT_CLINIC_OR_DEPARTMENT_OTHER): Payer: Self-pay

## 2017-12-18 DIAGNOSIS — R5383 Other fatigue: Secondary | ICD-10-CM

## 2017-12-18 DIAGNOSIS — G4710 Hypersomnia, unspecified: Secondary | ICD-10-CM

## 2018-01-16 ENCOUNTER — Encounter: Payer: Self-pay | Admitting: Gastroenterology

## 2018-01-27 ENCOUNTER — Ambulatory Visit (HOSPITAL_BASED_OUTPATIENT_CLINIC_OR_DEPARTMENT_OTHER): Payer: Medicare Other | Attending: Physical Medicine and Rehabilitation | Admitting: Internal Medicine

## 2018-01-27 ENCOUNTER — Encounter (HOSPITAL_BASED_OUTPATIENT_CLINIC_OR_DEPARTMENT_OTHER): Payer: Self-pay

## 2018-01-27 DIAGNOSIS — R0683 Snoring: Secondary | ICD-10-CM | POA: Insufficient documentation

## 2018-01-27 DIAGNOSIS — G4710 Hypersomnia, unspecified: Secondary | ICD-10-CM | POA: Diagnosis present

## 2018-01-27 DIAGNOSIS — R5383 Other fatigue: Secondary | ICD-10-CM | POA: Diagnosis present

## 2018-02-10 ENCOUNTER — Ambulatory Visit: Payer: Self-pay | Admitting: Gastroenterology

## 2018-02-11 DIAGNOSIS — G4710 Hypersomnia, unspecified: Secondary | ICD-10-CM | POA: Diagnosis not present

## 2018-02-11 NOTE — Procedures (Signed)
   Patient Name: Amy Glover, Amy Glover Study Date: 01/27/2018 Gender: Female D.O.B: [birth year] Age (years): 26 Referring Provider: Margaretha Sheffield Height (inches): 80 Interpreting Physician: Baird Lyons MD, ABSM Weight (lbs): 155 RPSGT: Jonna Coup BMI: 28 MRN: [id number] Neck Size: 14.50  CLINICAL INFORMATION Sleep Study Type: HST Indication for sleep study: Fatigue Epworth Sleepiness Score: 8  SLEEP STUDY TECHNIQUE A multi-channel overnight portable sleep study was performed. The channels recorded were: nasal airflow, thoracic respiratory movement, and oxygen saturation with a pulse oximetry. Snoring was also monitored.  MEDICATIONS Patient self administered medications include: none reported.  SLEEP ARCHITECTURE Patient was studied for 559 minutes. The sleep efficiency was 99.7 % and the patient was supine for 65.9%. The arousal index was 0.0 per hour.  RESPIRATORY PARAMETERS The overall AHI was 4.9 per hour, with a central apnea index of 0.0 per hour.  The oxygen nadir was 66% during sleep.  CARDIAC DATA Mean heart rate during sleep was 76.7 bpm.  IMPRESSIONS - Occasional obstructive apneas, within normal limits (AHI = 4.9/h). - No significant central sleep apnea occurred during this study (CAI = 0.0/h). - Oxygen desaturation was noted during this study (Min O2 = 66%, Mean 94%). - Patient snored.  DIAGNOSIS         Primary snoring  RECOMMENDATIONS - Be careful with alcohol, sedatives and other CNS depressants that may worsen sleep apnea and disrupt normal sleep architecture. - Sleep hygiene should be reviewed to assess factors that may improve sleep quality. - Weight management and regular exercise should be initiated or continued.  [Electronically signed] 02/11/2018 02:40 PM  Baird Lyons MD, [place], American Board of Sleep Medicine   NPI: [id number]                          [place], [place] [facility]  ELECTRONICALLY SIGNED ON:  02/11/2018, 2:36 PM [place] PH: [phone]   FX: [phone] [place]

## 2018-02-23 ENCOUNTER — Encounter: Payer: Self-pay | Admitting: Cardiology

## 2018-03-09 ENCOUNTER — Encounter: Payer: Self-pay | Admitting: Cardiology

## 2018-03-09 ENCOUNTER — Ambulatory Visit (INDEPENDENT_AMBULATORY_CARE_PROVIDER_SITE_OTHER): Payer: Medicare Other | Admitting: Cardiology

## 2018-03-09 VITALS — BP 90/60 | HR 89 | Ht 62.0 in | Wt 155.1 lb

## 2018-03-09 DIAGNOSIS — I10 Essential (primary) hypertension: Secondary | ICD-10-CM

## 2018-03-09 DIAGNOSIS — Z789 Other specified health status: Secondary | ICD-10-CM

## 2018-03-09 DIAGNOSIS — I428 Other cardiomyopathies: Secondary | ICD-10-CM

## 2018-03-09 DIAGNOSIS — E785 Hyperlipidemia, unspecified: Secondary | ICD-10-CM | POA: Diagnosis not present

## 2018-03-09 DIAGNOSIS — R296 Repeated falls: Secondary | ICD-10-CM

## 2018-03-09 MED ORDER — LISINOPRIL 2.5 MG PO TABS: 2.5000 mg | ORAL_TABLET | Freq: Every day | ORAL | 3 refills | Status: AC

## 2018-03-09 MED ORDER — ROSUVASTATIN CALCIUM 10 MG PO TABS: 10.0000 mg | ORAL_TABLET | Freq: Every day | ORAL | 2 refills | Status: DC

## 2018-03-09 NOTE — Addendum Note (Signed)
Addended by: Nuala Alpha on: 03/09/2018 03:28 PM   Modules accepted: Orders
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
## 2018-03-09 NOTE — Patient Instructions (Signed)
Medication Instructions:   STOP TAKING ATORVASTATIN NOW   START TAKING ROSUVASTATIN (CRESTOR) 10 MG ONCE DAILY  DECREASE YOUR LISINOPRIL TO 2.5 MG ONCE DAILY     Follow-Up:  Your physician wants you to follow-up in: [place] will receive a reminder letter in the mail two months in advance. If you don't receive a letter, please call our office to schedule the follow-up appointment.        If you need a refill on your cardiac medications before your next appointment, please call your pharmacy.

## 2018-03-12 ENCOUNTER — Telehealth (HOSPITAL_COMMUNITY): Payer: Self-pay

## 2018-03-12 NOTE — Telephone Encounter (Signed)
Called to schedule mri, no answer, left vm. AW 

## 2018-03-16 ENCOUNTER — Other Ambulatory Visit: Payer: Self-pay | Admitting: Physical Medicine and Rehabilitation

## 2018-03-16 DIAGNOSIS — M5412 Radiculopathy, cervical region: Secondary | ICD-10-CM

## 2018-03-18 ENCOUNTER — Other Ambulatory Visit (HOSPITAL_COMMUNITY): Payer: Self-pay | Admitting: Interventional Radiology

## 2018-03-18 DIAGNOSIS — I729 Aneurysm of unspecified site: Secondary | ICD-10-CM

## 2018-03-20 ENCOUNTER — Ambulatory Visit
Admission: RE | Admit: 2018-03-20 | Discharge: 2018-03-20 | Disposition: A | Discharge status: Home / Self Care | Payer: Medicare Other | Source: Ambulatory Visit | Attending: Physical Medicine and Rehabilitation | Admitting: Physical Medicine and Rehabilitation

## 2018-03-20 DIAGNOSIS — M5412 Radiculopathy, cervical region: Secondary | ICD-10-CM

## 2018-03-26 ENCOUNTER — Other Ambulatory Visit (HOSPITAL_COMMUNITY): Payer: Self-pay | Admitting: Interventional Radiology

## 2018-03-26 ENCOUNTER — Ambulatory Visit (HOSPITAL_COMMUNITY)
Admission: RE | Admit: 2018-03-26 | Discharge: 2018-03-26 | Disposition: A | Discharge status: Home / Self Care | Payer: Medicare Other | Source: Ambulatory Visit | Attending: Interventional Radiology | Admitting: Interventional Radiology

## 2018-03-26 ENCOUNTER — Ambulatory Visit (HOSPITAL_COMMUNITY): Payer: Medicare Other

## 2018-03-26 DIAGNOSIS — I729 Aneurysm of unspecified site: Secondary | ICD-10-CM

## 2018-03-26 LAB — CREATININE, SERUM
CREATININE: 1.09 mg/dL — AB (ref 0.44–1.00)
GFR calc Af Amer: >60 mL/min (ref >60)
GFR calc non Af Amer: 52 mL/min — ABNORMAL LOW (ref >60)

## 2018-03-26 MED ORDER — GADOBENATE DIMEGLUMINE 529 MG/ML IV SOLN
15.0000 mL | Freq: Once | INTRAVENOUS | Status: AC
  Administered 2018-03-26: 15 mL via INTRAVENOUS

## 2018-03-30 ENCOUNTER — Telehealth (HOSPITAL_COMMUNITY): Payer: Self-pay

## 2018-03-30 NOTE — Telephone Encounter (Signed)
Called pt regarding recent mri, no answer, left vm. AW 

## 2018-03-31 ENCOUNTER — Telehealth (HOSPITAL_COMMUNITY): Payer: Self-pay

## 2018-03-31 NOTE — Telephone Encounter (Signed)
Pt agreed to f/u in 1 year with mri/mra wo. AW 

## 2018-05-04 ENCOUNTER — Encounter: Payer: Self-pay | Admitting: Neurology

## 2018-05-04 ENCOUNTER — Ambulatory Visit (INDEPENDENT_AMBULATORY_CARE_PROVIDER_SITE_OTHER): Payer: Medicare Other | Admitting: Neurology

## 2018-05-04 VITALS — BP 118/60 | HR 83 | Wt 158.5 lb

## 2018-05-04 DIAGNOSIS — G43019 Migraine without aura, intractable, without status migrainosus: Secondary | ICD-10-CM

## 2018-05-04 MED ORDER — TOPIRAMATE 50 MG PO TABS: ORAL_TABLET | ORAL | 3 refills | Status: DC

## 2018-05-04 MED ORDER — TOPIRAMATE 100 MG PO TABS: 100.0000 mg | ORAL_TABLET | Freq: Two times a day (BID) | ORAL | 1 refills | Status: AC

## 2018-05-04 MED ORDER — PROPRANOLOL HCL 20 MG PO TABS: 20.0000 mg | ORAL_TABLET | Freq: Two times a day (BID) | ORAL | 3 refills | Status: DC

## 2018-05-04 NOTE — Progress Notes (Signed)
Reason for visit: Headache, vertigo, gait disorder  Amy Glover is an 67 y.o. female  History of present illness:  Amy Glover is a 68 year old right-handed white female with a history of chronic migraine headache.  The patient is having on average 10 headache days a month.  She may have headaches to go on for 2 to 3 days and then go away for a week or 2.  The patient indicates that weather is a significant activator for her headache.  She has neck and low back pain and is followed through the office of Dr. Nicholaus Bloom.  The patient has ongoing issues with vertigo, vestibular rehabilitation did not help.  The patient is on daily meclizine which does help, she takes low-dose alprazolam which may also help some.  The patient has had right knee surgery since last seen.  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  The patient is on Topamax taking 100 mg twice daily.  She tolerates the drug well.  Past Medical History:  Diagnosis Date  . Abnormality of gait 08/08/2015  . Anxiety   . Brain aneurysm    right ACOM  . COPD (chronic obstructive pulmonary disease) ([place])   . Depression   . Dizziness and giddiness 12/07/2013  . Dyslipidemia   . Fall   . GERD (gastroesophageal reflux disease)   . Hypertension   . Migraine without aura, without mention of intractable migraine without mention of status migrainosus 12/07/2013  . Subclavian steal syndrome     Past Surgical History:  Procedure Laterality Date  . BASIL CELL REMOVAL  2015   NOSE  . BRAIN SURGERY  04/20011   right ACOM aneurysm clip  . CESAREAN SECTION  1984  . IR GENERIC HISTORICAL  05/10/2016   IR RADIOLOGIST EVAL & MGMT 05/10/2016 [facility]  . IR GENERIC HISTORICAL  05/28/2016   IR ANGIO INTRA EXTRACRAN SEL COM CAROTID INNOMINATE BILAT MOD SED 05/28/2016 Luanne Bras, MD [facility]  . IR GENERIC HISTORICAL  05/28/2016   IR ANGIO VERTEBRAL SEL SUBCLAVIAN INNOMINATE BILAT MOD SED 05/28/2016 Luanne Bras, MD [facility]  . IR GENERIC HISTORICAL  05/28/2016   IR 3D INDEPENDENT WKST 05/28/2016 Luanne Bras, MD [facility]  . IR GENERIC HISTORICAL  06/12/2016   IR RADIOLOGIST EVAL & MGMT 06/12/2016 [facility]  . IR RADIOLOGIST EVAL & MGMT  01/09/2017  . KNEE ARTHROSCOPY     and shoulder  . SHOULDER ARTHROSCOPY W/ ROTATOR CUFF REPAIR Right 2017  . SHOULDER SURGERY  2008  . STENT IN NECK  2009  . TONSILLECTOMY AND ADENOIDECTOMY  1961  . TOTAL KNEE ARTHROPLASTY Right 04/21/2017  . TOTAL KNEE ARTHROPLASTY Right 04/21/2017   Procedure: RIGHT TOTAL KNEE ARTHROPLASTY;  Surgeon: Vickey Huger, MD;  Location: [place];  Service: Orthopedics;  Laterality: Right;  . TUBAL LIGATION  1987  . WRIST SURGERY Right 2009    Family History  Problem Relation Age of Onset  . Heart disease Mother   . Hypertension Father   . Cancer Father   . Multiple sclerosis Sister   . Seizures Sister     Social history:  reports that she quit smoking about 9 years ago. Her smoking use included cigarettes. She has a 35.00 pack-year smoking history. She has never used smokeless tobacco. She reports that she drinks alcohol. She reports that she does not use drugs.    Allergies  Allergen Reactions  . Estradiol Hives    Can  take name brand only  . Sulfa Antibiotics Hives and Nausea Only    Hives  . Atorvastatin Other (See Comments)    Causes muscle pain    Medications:  Prior to Admission medications   Medication Sig Start Date End Date Taking? Authorizing Provider  alendronate (FOSAMAX) 70 MG tablet Take 70 mg by mouth once a week. Take with a full glass of water on an empty stomach.   Yes [provider]  ALPRAZolam (XANAX) 0.25 MG tablet Take 0.25-0.5 mg by mouth 2 (two) times daily as needed for anxiety (with meclizine for anxiety and dizziness).   Yes [provider]  aspirin 81 MG tablet Take 162 mg by mouth daily.    Yes [provider]  b complex vitamins tablet  Take 1 tablet by mouth daily.   Yes [provider]  budesonide-formoterol (SYMBICORT) 160-4.5 MCG/ACT inhaler Inhale 2 puffs into the lungs 2 (two) times daily.   Yes [provider]  Calcium Carb-Cholecalciferol (CALCIUM 600+D3 PO) Take 600 mg by mouth daily. 2 tablets in the morning and 1 tablet at night. Takes total 1800mg  daily.   Yes [provider]  carvedilol (COREG) 6.25 MG tablet Take 3.125 mg by mouth 2 (two) times daily with a meal.    Yes [provider]  Cholecalciferol (VITAMIN D3) 5000 UNITS TABS Take 1 tablet by mouth daily.   Yes [provider]  cyclobenzaprine (FLEXERIL) 5 MG tablet Take 5 mg by mouth 3 (three) times daily as needed for muscle spasms.   Yes [provider]  Estradiol (ESTRACE PO) Take 1 Dose by mouth as needed. TAKES NAME BRAND ONLY   Yes [provider]  fluticasone (FLONASE) 50 MCG/ACT nasal spray Place 1 spray into both nostrils daily.   Yes [provider]  furosemide (LASIX) 40 MG tablet Take 1 tablet (40 mg total) by mouth daily as needed for edema. For lower extremity edema 08/13/17  Yes Dorothy Spark, MD  ibuprofen (ADVIL,MOTRIN) 200 MG tablet Take 200 mg by mouth every 6 (six) hours as needed.   Yes [provider]  lisinopril (PRINIVIL,ZESTRIL) 2.5 MG tablet Take 1 tablet (2.5 mg total) by mouth daily. 03/09/18  Yes Dorothy Spark, MD  meclizine (ANTIVERT) 25 MG tablet Take 25 mg by mouth 3 (three) times daily.  11/25/13  Yes [provider]  Nutritional Supplements (JUICE PLUS FIBRE PO) Take 1 Dose by mouth 2 (two) times daily.   Yes [provider]  Omega-3 Fatty Acids (FISH OIL) 1000 MG CAPS Take 1,000 mg by mouth 2 (two) times daily.   Yes [provider]  omeprazole (PRILOSEC) 40 MG capsule Take 40 mg by mouth daily.   Yes [provider]  potassium chloride SA (K-DUR,KLOR-CON) 20 MEQ tablet Take 40 mEq by mouth daily.    Yes  [provider]  promethazine (PHENERGAN) 25 MG tablet Take 25 mg by mouth every 8 (eight) hours as needed. For nausea   Yes [provider]  rosuvastatin (CRESTOR) 10 MG tablet Take 1 tablet (10 mg total) by mouth daily. 03/09/18  Yes Dorothy Spark, MD  topiramate (TOPAMAX) 100 MG tablet Take 1 tablet (100 mg total) by mouth 2 (two) times daily. 05/04/18  Yes Kathrynn Ducking, MD  Vilazodone HCl (VIIBRYD) 40 MG TABS Take 40 mg by mouth daily.   Yes [provider]  zolpidem (AMBIEN) 10 MG tablet Take 5 mg by mouth at bedtime as needed  for sleep. For sleep    Yes [provider]    ROS:  Out of a complete 14 system review of symptoms, the patient complains only of the following symptoms, and all other reviewed systems are negative.  Ringing in the ears, difficulty swallowing Blurred vision Constipation Back pain, walking difficulty, neck pain Memory loss, headache, numbness  Blood pressure 118/60, pulse 83, weight 158 lb 8 oz (71.9 kg), SpO2 98 %.  Physical Exam  General: The patient is alert and cooperative at the time of the examination.  Skin: No significant peripheral edema is noted.   Neurologic Exam  Mental status: The patient is alert and oriented x 3 at the time of the examination. The patient has apparent normal recent and remote memory, with an apparently normal attention span and concentration ability.   Cranial nerves: Facial symmetry is present. Speech is normal, no aphasia or dysarthria is noted. Extraocular movements are full. Visual fields are full.  Motor: The patient has good strength in all 4 extremities.  Sensory examination: Soft touch sensation is symmetric on the face, arms, and legs.  Coordination: The patient has good finger-nose-finger and heel-to-shin bilaterally.  Gait and station: The patient has a slightly wide-based gait, slightly unsteady. Tandem gait is unsteady. Romberg is negative. No drift is  seen.  Reflexes: Deep tendon reflexes are symmetric.   Assessment/Plan:  1.  Intractable migraine headache  2.  Chronic dizziness, vertigo  3.  Gait disturbance  The patient continues to fall on intermittent basis.  She may fall with without the sensation of vertigo.  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  She will remain on Topamax 100 mg twice daily, she will be placed on propranolol 20 mg twice daily for her migraine.  The patient will follow-up to this office in 6 months.  Jill Alexanders MD 05/04/2018 12:54 PM  [facility] [address] [place], [place] [address]  Phone [phone] Fax [phone]

## 2018-05-04 NOTE — Patient Instructions (Signed)
We will start propranolol for the migraine.  Inderal (propranolol) is a blood pressure medication that is commonly used for migraine headaches. This is a type of beta blocker. The most common side effects include low heart rate, dizziness, fatigue, and increased depression. This medication may worsen asthma. If you believe that you are having side effects on this medication, please contact our office.

## 2018-07-08 ENCOUNTER — Telehealth (HOSPITAL_COMMUNITY): Payer: Self-pay | Admitting: Radiology

## 2018-07-10 ENCOUNTER — Telehealth: Payer: Self-pay | Admitting: *Deleted

## 2018-07-10 NOTE — Telephone Encounter (Signed)
Dr. Meda Coffee can pt hold asa for 7 days for surgery - an injection L3-4, L4-5, L5-S1 please send result to CV pre-op pool

## 2018-07-10 NOTE — Telephone Encounter (Signed)
Yes, she can hold aspirin 7 days prior to injection and restart a week post.  Ena Dawley, MD

## 2018-07-10 NOTE — Telephone Encounter (Signed)
   [facility] Pre-operative Risk Assessment    Request for surgical clearance:  1. What type of surgery is being performed? BILATERAL L3-4, L4-5, L5-S1 FACET INJECTION   2. When is this surgery scheduled? 07/22/2018   3. Are there any medications that need to be held prior to surgery and how long? ASPIRIN X 7 DAYS   4. Practice name and name of physician performing surgery? [facility]; DR. Greta Doom   5. What is your office phone and fax number? P# [phone]; F# [phone]   6. Anesthesia type (None, local, MAC, general) ? LIDOCAINE   Julaine Hua 07/10/2018, 9:28 AM  _________________________________________________________________   (provider comments below)

## 2018-07-10 NOTE — Telephone Encounter (Signed)
   Primary Cardiologist: Ena Dawley, MD  Chart reviewed as part of pre-operative protocol coverage. Given past medical history and time since last visit, based on ACC/AHA guidelines, MADDISYN HEGWOOD would be at acceptable risk for the planned procedure without further cardiovascular testing.   OK to hold Asprin 7 days prior to injections and restart a week post.   I will route this recommendation to the requesting party via [facility] fax function and remove from pre-op pool.  Please call with questions.  Cecilie Kicks, NP 07/10/2018, 4:12 PM

## 2018-10-06 ENCOUNTER — Ambulatory Visit (INDEPENDENT_AMBULATORY_CARE_PROVIDER_SITE_OTHER): Payer: Medicare Other | Admitting: Physician Assistant

## 2018-10-06 ENCOUNTER — Encounter (INDEPENDENT_AMBULATORY_CARE_PROVIDER_SITE_OTHER): Payer: Self-pay

## 2018-10-06 ENCOUNTER — Encounter: Payer: Self-pay | Admitting: Physician Assistant

## 2018-10-06 VITALS — BP 100/60 | HR 95 | Ht 63.0 in | Wt 160.0 lb

## 2018-10-06 DIAGNOSIS — E782 Mixed hyperlipidemia: Secondary | ICD-10-CM | POA: Diagnosis not present

## 2018-10-06 DIAGNOSIS — I428 Other cardiomyopathies: Secondary | ICD-10-CM

## 2018-10-06 DIAGNOSIS — R42 Dizziness and giddiness: Secondary | ICD-10-CM

## 2018-10-06 DIAGNOSIS — I10 Essential (primary) hypertension: Secondary | ICD-10-CM

## 2018-10-06 NOTE — Patient Instructions (Signed)
Medication Instructions:  Your physician recommends that you continue on your current medications as directed. Please refer to the Current Medication list given to you today.  If you need a refill on your cardiac medications before your next appointment, please call your pharmacy.   Lab work: None Ordered  If you have labs (blood work) drawn today and your tests are completely normal, you will receive your results only by: Marland Kitchen MyChart Message (if you have MyChart) OR . A paper copy in the mail If you have any lab test that is abnormal or we need to change your treatment, we will call you to review the results.  Testing/Procedures: None ordered  Follow-Up: At [facility], you and your health needs are our priority.  As part of our continuing mission to provide you with exceptional heart care, we have created designated Provider Care Teams.  These Care Teams include your primary Cardiologist (physician) and Advanced Practice Providers (APPs -  Physician Assistants and Nurse Practitioners) who all work together to provide you with the care you need, when you need it. . You will need a follow up appointment in 1 year.  Please call our office 2 months in advance to schedule this appointment.  You may see Ena Dawley, MD or one of the following Advanced Practice Providers on your designated Care Team:   . Lyda Jester, PA-C . Dayna Dunn, PA-C . Ermalinda Barrios, PA-C  Any Other Special Instructions Will Be Listed Below (If Applicable).  Please have your Primary Care Doctor fax [place] a copy of your lab results
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
## 2018-11-05 ENCOUNTER — Other Ambulatory Visit: Payer: Self-pay

## 2018-11-05 MED ORDER — ROSUVASTATIN CALCIUM 20 MG PO TABS: 20.0000 mg | ORAL_TABLET | Freq: Every day | ORAL | 3 refills | Status: AC

## 2018-11-17 ENCOUNTER — Telehealth: Payer: Self-pay

## 2018-11-17 NOTE — Telephone Encounter (Signed)
I contacted the pt and had a 15 minute conversation with her. I advised due to covid-19 concerns our office is limiting the number of patient's coming into the clinic for the next two weeks. I advised our office is providing telephone visits during this time for our f/u's. Also stated the telephone visit will be billed through insurance and due to hippa concerns is not as secure as a face to face encounter. However, once a verbal consent is provided we can schedule this appointment.  Consent was given for the telephone visit. She states the best # would be [phone].  I reviewed pt's med, allergies and pharmacy with her. She states over all she is doing well but over 1 month ago she had a "black out" spell while eating lunch with her husband.

## 2018-11-18 ENCOUNTER — Other Ambulatory Visit: Payer: Self-pay

## 2018-11-18 ENCOUNTER — Encounter: Payer: Self-pay | Admitting: Neurology

## 2018-11-18 ENCOUNTER — Telehealth (INDEPENDENT_AMBULATORY_CARE_PROVIDER_SITE_OTHER): Payer: Medicare Other | Admitting: Neurology

## 2018-11-18 DIAGNOSIS — R269 Unspecified abnormalities of gait and mobility: Secondary | ICD-10-CM

## 2018-11-18 DIAGNOSIS — R42 Dizziness and giddiness: Secondary | ICD-10-CM | POA: Diagnosis not present

## 2018-11-18 DIAGNOSIS — R404 Transient alteration of awareness: Secondary | ICD-10-CM | POA: Insufficient documentation

## 2018-11-18 DIAGNOSIS — G43019 Migraine without aura, intractable, without status migrainosus: Secondary | ICD-10-CM

## 2018-11-18 MED ORDER — LEVETIRACETAM 250 MG PO TABS
250.0000 mg | ORAL_TABLET | Freq: Two times a day (BID) | ORAL | 3 refills | Status: DC
Start: 2018-11-18 — End: 2019-05-12

## 2018-11-18 NOTE — Patient Instructions (Signed)
Begin Keppra 250 mg twice daily.  Call for any dose adjustments.
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
## 2019-03-23 ENCOUNTER — Ambulatory Visit: Payer: Self-pay | Admitting: Neurology

## 2019-04-19 ENCOUNTER — Telehealth (HOSPITAL_COMMUNITY): Payer: Self-pay

## 2019-04-19 NOTE — Telephone Encounter (Signed)
Called to schedule f/u mri, no answer, vm full. AW 

## 2019-04-22 ENCOUNTER — Encounter: Payer: Self-pay | Admitting: Gastroenterology

## 2019-04-26 ENCOUNTER — Telehealth: Payer: Self-pay | Admitting: *Deleted

## 2019-04-26 NOTE — Telephone Encounter (Signed)
   Primary Cardiologist: Ena Dawley, MD  Chart reviewed as part of pre-operative protocol coverage. Given past medical history and time since last visit, based on ACC/AHA guidelines, Amy Glover would be at acceptable risk for the planned procedure without further cardiovascular testing.   Ok to hold ASA x 7 days. Resume once safe to do so.   I will route this recommendation to the requesting party via Epic fax function and remove from pre-op pool.  Please call with questions.  Lyda Jester, PA-C 04/26/2019, 9:47 AM

## 2019-04-26 NOTE — Telephone Encounter (Signed)
   [facility] Pre-operative Risk Assessment    Request for surgical clearance:  1. What type of surgery is being performed? B/L FACET INJECTION L3-4, L4-5, L5-S1  2. When is this surgery scheduled? 05/24/19   3. What type of clearance is required (medical clearance vs. Pharmacy clearance to hold med vs. Both)? MEDICAL  4. Are there any medications that need to be held prior to surgery and how long? ASA X 7 DAYS PRIOR TO INJECTION   5. Practice name and name of physician performing surgery? [facility] DR. MARY BODEN   6. What is your office phone number [phone]    7.   What is your office fax number [phone]  8.   Anesthesia type (None, local, MAC, general) ? NONE LISTED; LOCAL?   Julaine Hua 04/26/2019, 8:24 AM  _________________________________________________________________   (provider comments below)

## 2019-05-12 ENCOUNTER — Other Ambulatory Visit: Payer: Self-pay

## 2019-05-12 ENCOUNTER — Ambulatory Visit (AMBULATORY_SURGERY_CENTER): Payer: Self-pay | Admitting: *Deleted

## 2019-05-12 VITALS — Temp 96.8°F | Ht 63.0 in | Wt 161.0 lb

## 2019-05-12 DIAGNOSIS — Z800 Family history of malignant neoplasm of digestive organs: Secondary | ICD-10-CM

## 2019-05-12 MED ORDER — NA SULFATE-K SULFATE-MG SULF 17.5-3.13-1.6 GM/177ML PO SOLN: ORAL | 0 refills | Status: DC

## 2019-05-12 NOTE — Progress Notes (Signed)
Patient and pt's husband ([id number])  is here in-person for PV. Patient denies any allergies to eggs or soy. Patient denies any problems with anesthesia/sedation. Patient denies any oxygen use at home. Patient denies taking any diet/weight loss medications or blood thinners. Patient is not being treated for MRSA or C-diff. EMMI education assisgned to patient on colonoscopy, this was explained and instructions given to patient.   Pt is aware that care partner will wait in the car during procedure; if they feel like they will be too hot to wait in the car; they may wait in the lobby.  We want them to wear a mask (we do not have any that we can provide them), practice social distancing, and we will check their temperatures when they get here.  I did remind patient that their care partner needs to stay in the parking lot the entire time. Pt will wear mask into building.

## 2019-05-13 ENCOUNTER — Encounter: Payer: Self-pay | Admitting: Gastroenterology

## 2019-05-20 ENCOUNTER — Other Ambulatory Visit (HOSPITAL_COMMUNITY): Payer: Self-pay | Admitting: Interventional Radiology

## 2019-05-20 DIAGNOSIS — I729 Aneurysm of unspecified site: Secondary | ICD-10-CM

## 2019-05-25 ENCOUNTER — Encounter: Payer: No Typology Code available for payment source | Admitting: Gastroenterology

## 2019-06-11 ENCOUNTER — Telehealth (HOSPITAL_COMMUNITY): Payer: Self-pay | Admitting: Radiology

## 2019-06-11 NOTE — Telephone Encounter (Signed)
Called pt, left VM that her MRI/MRA scheduled for 06/14/19 has been cancelled due to authorization not yet being obtained. Will call pt to reschedule once we receive auth. JM

## 2019-06-14 ENCOUNTER — Encounter (HOSPITAL_COMMUNITY): Payer: Self-pay

## 2019-06-14 ENCOUNTER — Ambulatory Visit (HOSPITAL_COMMUNITY): Payer: Medicare HMO

## 2019-06-15 ENCOUNTER — Telehealth (HOSPITAL_COMMUNITY): Payer: Self-pay

## 2019-06-15 NOTE — Telephone Encounter (Signed)
Called to reschedule mri, no answer, left vm. AW  

## 2019-06-29 ENCOUNTER — Ambulatory Visit (AMBULATORY_SURGERY_CENTER): Payer: Medicare HMO | Admitting: Gastroenterology

## 2019-06-29 ENCOUNTER — Other Ambulatory Visit: Payer: Self-pay

## 2019-06-29 ENCOUNTER — Telehealth: Payer: Self-pay | Admitting: *Deleted

## 2019-06-29 ENCOUNTER — Encounter: Payer: Self-pay | Admitting: Gastroenterology

## 2019-06-29 VITALS — BP 146/98 | HR 85 | Temp 97.9°F | Resp 23 | Ht 63.0 in | Wt 161.0 lb

## 2019-06-29 DIAGNOSIS — D122 Benign neoplasm of ascending colon: Secondary | ICD-10-CM | POA: Diagnosis not present

## 2019-06-29 DIAGNOSIS — Z1211 Encounter for screening for malignant neoplasm of colon: Secondary | ICD-10-CM

## 2019-06-29 DIAGNOSIS — Z800 Family history of malignant neoplasm of digestive organs: Secondary | ICD-10-CM

## 2019-06-29 MED ORDER — FLEET ENEMA 7-19 GM/118ML RE ENEM
1.0000 | ENEMA | Freq: Once | RECTAL | Status: AC
  Administered 2019-06-29: 1 via RECTAL

## 2019-06-29 MED ORDER — SODIUM CHLORIDE 0.9 % IV SOLN: 500.0000 mL | Freq: Once | INTRAVENOUS | Status: DC

## 2019-06-29 NOTE — Patient Instructions (Signed)
YOU HAD AN ENDOSCOPIC PROCEDURE TODAY AT [facility]:   Refer to the procedure report that was given to you for any specific questions about what was found during the examination.  If the procedure report does not answer your questions, please call your gastroenterologist to clarify.  If you requested that your care partner not be given the details of your procedure findings, then the procedure report has been included in a sealed envelope for you to review at your convenience later.  **Handouts given on Polyps, diverticulosis and hemorrhoids**  YOU SHOULD EXPECT: Some feelings of bloating in the abdomen. Passage of more gas than usual.  Walking can help get rid of the air that was put into your GI tract during the procedure and reduce the bloating. If you had a lower endoscopy (such as a colonoscopy or flexible sigmoidoscopy) you may notice spotting of blood in your stool or on the toilet paper. If you underwent a bowel prep for your procedure, you may not have a normal bowel movement for a few days.  Please Note:  You might notice some irritation and congestion in your nose or some drainage.  This is from the oxygen used during your procedure.  There is no need for concern and it should clear up in a day or so.  SYMPTOMS TO REPORT IMMEDIATELY:   Following lower endoscopy (colonoscopy or flexible sigmoidoscopy):  Excessive amounts of blood in the stool  Significant tenderness or worsening of abdominal pains  Swelling of the abdomen that is new, acute  Fever of 100F or higher    For urgent or emergent issues, a gastroenterologist can be reached at any hour by calling [phone].   DIET:  We do recommend a small meal at first, but then you may proceed to your regular diet.  Drink plenty of fluids but you should avoid alcoholic beverages for 24 hours.  ACTIVITY:  You should plan to take it easy for the rest of today and you should NOT DRIVE or use heavy machinery until  tomorrow (because of the sedation medicines used during the test).    FOLLOW UP: Our staff will call the number listed on your records 48-72 hours following your procedure to check on you and address any questions or concerns that you may have regarding the information given to you following your procedure. If we do not reach you, we will leave a message.  We will attempt to reach you two times.  During this call, we will ask if you have developed any symptoms of COVID 19. If you develop any symptoms (ie: fever, flu-like symptoms, shortness of breath, cough etc.) before then, please call [phone].  If you test positive for Covid 19 in the 2 weeks post procedure, please call and report this information to [place].    If any biopsies were taken you will be contacted by phone or by letter within the next 1-3 weeks.  Please call us at [phone] if you have not heard about the biopsies in 3 weeks.    SIGNATURES/CONFIDENTIALITY: You and/or your care partner have signed paperwork which will be entered into your electronic medical record.  These signatures attest to the fact that that the information above on your After Visit Summary has been reviewed and is understood.  Full responsibility of the confidentiality of this discharge information lies with you and/or your care-partner.

## 2019-06-29 NOTE — Progress Notes (Signed)
Called to room to assist during endoscopic procedure.  Patient ID and intended procedure confirmed with present staff. Received instructions for my participation in the procedure from the performing physician.  

## 2019-06-29 NOTE — Telephone Encounter (Signed)
Pt called and reports that she threw up 2nd half of prep.  She states that her stools are murky brown, not clear with some solid pieces.  Spoke with Dr. Lyndel Safe.  He advises her to drink 1 bottle of magnesium citrate and plan to be here at 1215 for 1:00 procedure.  Encouraged her to drink water as well.  She is to stop drinking liquids by 1100.  Pt verbalized understanding.

## 2019-06-29 NOTE — Op Note (Addendum)
[place] Patient Name: Amy Glover Procedure Date: 06/29/2019 12:45 PM MRN: [id number] Endoscopist: Jackquline Denmark , MD Age: 68 Referring MD:  Date of Birth: [birth year] Gender: Female Account #: [id number] Procedure:                Colonoscopy Indications:              Screening in patient at increased risk: Family                            history of colon cancer dad (age 52), brother at                            age 24. Medicines:                Monitored Anesthesia Care Procedure:                Pre-Anesthesia Assessment:                           - Prior to the procedure, a History and Physical                            was performed, and patient medications and                            allergies were reviewed. The patient's tolerance of                            previous anesthesia was also reviewed. The risks                            and benefits of the procedure and the sedation                            options and risks were discussed with the patient.                            All questions were answered, and informed consent                            was obtained. Prior Anticoagulants: The patient has                            taken no previous anticoagulant or antiplatelet                            agents. ASA Grade Assessment: III - A patient with                            severe systemic disease. After reviewing the risks                            and benefits, the patient was deemed in  satisfactory condition to undergo the procedure.                           After obtaining informed consent, the colonoscope                            was passed under direct vision. Throughout the                            procedure, the patient's blood pressure, pulse, and                            oxygen saturations were monitored continuously. The                            Colonoscope was introduced through the anus and                         advanced to the the cecum, identified by                            appendiceal orifice and ileocecal valve. The                            colonoscopy was performed without difficulty. The                            patient tolerated the procedure well. The quality                            of the bowel preparation was adequate to identify                            polyps. She only tolerated 1 dose of Suprep.                            Thereafter a bottle of mag citrate was also tried.                            Unfortunately, she had nausea vomiting with mag                            citrate as well. She was given an enema on arrival.                            She still had adherent stool in the right colon.                            Aggressive suctioning and aspiration was performed.                            Of note that the small and flat lesions could have  been missed. Overall over 95% with chronic mucus                            was visualized satisfactorily. The ileocecal valve,                            appendiceal orifice, and rectum were photographed. Scope In: 1:14:16 PM Scope Out: 1:31:16 PM Scope Withdrawal Time: 0 hours 10 minutes 44 seconds  Total Procedure Duration: 0 hours 17 minutes 0 seconds  Findings:                 Two sessile polyps were found in the mid ascending                            colon and distal ascending colon. The polyps were 2                            to 4 mm in size. These polyps were removed with a                            cold snare. Resection and retrieval were complete.                           A few small-mouthed diverticula were found in the                            sigmoid colon.                           Non-bleeding internal hemorrhoids were found during                            retroflexion. The hemorrhoids were small.                           The exam was otherwise without  abnormality. Complications:            No immediate complications. Estimated Blood Loss:     Estimated blood loss: none. Impression:               -Diminutive colonic polyps s/p polypectomy.                           -Mild sigmoid diverticulosis.                           -Otherwise normal colonoscopy. Recommendation:           - Patient has a contact number available for                            emergencies. The signs and symptoms of potential                            delayed complications were discussed with the  patient. Return to normal activities tomorrow.                            Written discharge instructions were provided to the                            patient.                           - Resume previous diet.                           - Continue present medications.                           - Await pathology results.                           - Repeat colonoscopy in 3 years for                            surveillance/screening with prepopik prep. Also,                            she will find out if her brother had genetic                            testing.                           - Return to GI clinic PRN. Jackquline Denmark, MD 06/29/2019 1:42:43 PM This report has been signed electronically. Addendum Number: 1   Addendum Date: 07/02/2019 8:21:27 PM      In the procedure above, there is a Diplomatic Services operational officer Error      Should read "colonic mucosa" instead of "chronic mucus"      RG Jackquline Denmark, MD 07/02/2019 8:23:01 PM This report has been signed electronically.

## 2019-06-29 NOTE — Progress Notes (Signed)
PT taken to PACU. Monitors in place. VSS. Report given to RN.  Pt vomited a small amount of brown colored fluid near the end of the procedure. Suctioning provided promptly. She is 98-100% on room air immediately following the case.   K.Lindwood Mogel, CRNA

## 2019-06-29 NOTE — Progress Notes (Signed)
Pt's stool cloudy brown, no solid stool noted.  1 fleets enema given per DO per pt.  Cloudy brown liquid returned in toilet, no solid stool noted  VS- [facility] Temperature- June Bullock  Pt's states no medical or surgical changes since previsit or office visit.

## 2019-07-01 ENCOUNTER — Telehealth: Payer: Self-pay | Admitting: *Deleted

## 2019-07-01 NOTE — Telephone Encounter (Signed)
  Follow up Call-  Call back number 06/29/2019  Post procedure Call Back phone  # [phone]  Permission to leave phone message Yes  Some recent data might be hidden     Patient questions:  Do you have a fever, pain , or abdominal swelling? No. Pain Score  0 *  Have you tolerated food without any problems? Yes.    Have you been able to return to your normal activities? Yes.    Do you have any questions about your discharge instructions: Diet   No. Medications  No. Follow up visit  No.  Do you have questions or concerns about your Care? No.  Actions: * If pain score is 4 or above: No action needed, pain <4.   1. Have you developed a fever since your procedure? no  2.   Have you had an respiratory symptoms (SOB or cough) since your procedure? no  3.   Have you tested positive for COVID 19 since your procedure no  4.   Have you had any family members/close contacts diagnosed with the COVID 19 since your procedure?  no   If yes to any of these questions please route to Joylene John, RN and Alphonsa Gin, Therapist, sports.

## 2019-07-02 ENCOUNTER — Encounter: Payer: Self-pay | Admitting: Gastroenterology

## 2019-07-12 ENCOUNTER — Other Ambulatory Visit: Payer: Self-pay

## 2019-07-12 ENCOUNTER — Ambulatory Visit (HOSPITAL_COMMUNITY)
Admission: RE | Admit: 2019-07-12 | Discharge: 2019-07-12 | Disposition: A | Discharge status: Home / Self Care | Payer: Medicare HMO | Source: Ambulatory Visit | Attending: Interventional Radiology | Admitting: Interventional Radiology

## 2019-07-12 DIAGNOSIS — I729 Aneurysm of unspecified site: Secondary | ICD-10-CM | POA: Diagnosis present

## 2019-07-12 DIAGNOSIS — I671 Cerebral aneurysm, nonruptured: Secondary | ICD-10-CM | POA: Insufficient documentation

## 2019-07-20 ENCOUNTER — Telehealth (HOSPITAL_COMMUNITY): Payer: Self-pay

## 2019-07-20 NOTE — Telephone Encounter (Signed)
Pt agreed to f/u in 1 year with mri/mra. AW  

## 2019-07-20 NOTE — Telephone Encounter (Signed)
Called pt regarding recent mri, no answer, left vm. AW 

## 2019-10-01 ENCOUNTER — Encounter: Payer: Self-pay | Admitting: Cardiology

## 2019-10-01 ENCOUNTER — Other Ambulatory Visit: Payer: Self-pay

## 2019-10-01 ENCOUNTER — Ambulatory Visit: Payer: Medicare Other | Admitting: Cardiology

## 2019-10-01 VITALS — BP 102/64 | HR 83 | Ht 64.0 in | Wt 160.4 lb

## 2019-10-01 DIAGNOSIS — U071 COVID-19: Secondary | ICD-10-CM

## 2019-10-01 DIAGNOSIS — E785 Hyperlipidemia, unspecified: Secondary | ICD-10-CM

## 2019-10-01 DIAGNOSIS — I10 Essential (primary) hypertension: Secondary | ICD-10-CM | POA: Diagnosis not present

## 2019-10-01 DIAGNOSIS — I119 Hypertensive heart disease without heart failure: Secondary | ICD-10-CM

## 2019-10-01 DIAGNOSIS — I428 Other cardiomyopathies: Secondary | ICD-10-CM

## 2019-10-01 NOTE — Patient Instructions (Signed)
Medication Instructions:   Your physician recommends that you continue on your current medications as directed. Please refer to the Current Medication list given to you today.  *If you need a refill on your cardiac medications before your next appointment, please call your pharmacy*    Testing/Procedures:  Your physician has requested that you have an echocardiogram. Echocardiography is a painless test that uses sound waves to create images of your heart. It provides your doctor with information about the size and shape of your heart and how well your heart's chambers and valves are working. This procedure takes approximately one hour. There are no restrictions for this procedure.    Follow-Up: At [facility], you and your health needs are our priority.  As part of our continuing mission to provide you with exceptional heart care, we have created designated Provider Care Teams.  These Care Teams include your primary Cardiologist (physician) and Advanced Practice Providers (APPs -  Physician Assistants and Nurse Practitioners) who all work together to provide you with the care you need, when you need it.  Your next appointment:   6 month(s)  The format for your next appointment:   In Person  Provider:   Ena Dawley, MD
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
## 2019-10-15 ENCOUNTER — Ambulatory Visit (HOSPITAL_COMMUNITY): Payer: Medicare Other | Attending: Cardiology

## 2019-10-15 ENCOUNTER — Other Ambulatory Visit: Payer: Self-pay

## 2019-10-15 DIAGNOSIS — I10 Essential (primary) hypertension: Secondary | ICD-10-CM | POA: Diagnosis not present

## 2019-10-15 DIAGNOSIS — U071 COVID-19: Secondary | ICD-10-CM | POA: Insufficient documentation

## 2019-10-15 DIAGNOSIS — I119 Hypertensive heart disease without heart failure: Secondary | ICD-10-CM | POA: Insufficient documentation

## 2019-10-15 DIAGNOSIS — E785 Hyperlipidemia, unspecified: Secondary | ICD-10-CM | POA: Diagnosis not present

## 2019-10-18 ENCOUNTER — Telehealth: Payer: Self-pay

## 2019-10-18 NOTE — Telephone Encounter (Signed)
LMTCB

## 2019-10-18 NOTE — Telephone Encounter (Signed)
-----   Message from Dorothy Spark, MD sent at 10/15/2019  7:34 PM EST ----- Normal ecgocardiogram

## 2020-02-02 ENCOUNTER — Telehealth: Payer: Self-pay

## 2020-02-02 NOTE — Telephone Encounter (Signed)
   [facility] Pre-operative Risk Assessment    HEARTCARE STAFF: - Please ensure there is not already an duplicate clearance open for this procedure. - Under Visit Info/Reason for Call, type in Other and utilize the format Clearance MM/DD/YY or Clearance TBD. Do not use dashes or single digits. - If request is for dental extraction, please clarify the # of teeth to be extracted.  Request for surgical clearance:  1. What type of surgery is being performed? Radio Frequency Ablation   2. When is this surgery scheduled? 02/15/20   3. What type of clearance is required (medical clearance vs. Pharmacy clearance to hold med vs. Both)? Pharmacy  4. Are there any medications that need to be held prior to surgery and how long?Aspirin 81 mg bid 7 days prior   5. Practice name and name of physician performing surgery? [facility] Dr Elta Guadeloupe Phillips/Dr. Corinna Capra   6. What is the office phone number? [phone]   7.   What is the office fax number?  [phone]  8.   Anesthesia type (None, local, MAC, general) ? Choice   Frederik Schmidt 02/02/2020, 3:30 PM  _________________________________________________________________   (provider comments below)

## 2020-02-03 NOTE — Telephone Encounter (Signed)
Yes, she can hold aspirin for 7 days

## 2020-02-03 NOTE — Telephone Encounter (Signed)
   Primary Cardiologist: Ena Dawley, MD  Chart reviewed as part of pre-operative protocol coverage. Given past medical history and time since last visit, based on ACC/AHA guidelines, Amy Glover would be at acceptable risk for the planned procedure without further cardiovascular testing.   Her aspirin may be held for 7 days prior to the procedure.  Please resume as soon as hemostasis is achieved.  I will route this recommendation to the requesting party via Epic fax function and remove from pre-op pool.  Please call with questions.  Jossie Ng. Regana Kemple NP-C    02/03/2020, 3:22 PM [facility] [address] Office [phone] Fax [phone]

## 2020-02-08 NOTE — Telephone Encounter (Signed)
Follow Up  Tomasita Crumble from [facility] is calling in to follow up on the clearance for patient. Please fax needed information pertaining to the clearance to [phone].

## 2020-03-30 ENCOUNTER — Emergency Department (HOSPITAL_COMMUNITY): Payer: Medicare Other

## 2020-03-30 ENCOUNTER — Emergency Department (HOSPITAL_COMMUNITY)
Admission: EM | Admit: 2020-03-30 | Discharge: 2020-03-31 | Disposition: A | Discharge status: Home / Self Care | Payer: Medicare Other | Attending: Emergency Medicine | Admitting: Emergency Medicine

## 2020-03-30 ENCOUNTER — Encounter (HOSPITAL_COMMUNITY): Payer: Self-pay

## 2020-03-30 DIAGNOSIS — R4182 Altered mental status, unspecified: Secondary | ICD-10-CM | POA: Diagnosis present

## 2020-03-30 DIAGNOSIS — Z5321 Procedure and treatment not carried out due to patient leaving prior to being seen by health care provider: Secondary | ICD-10-CM | POA: Diagnosis not present

## 2020-03-30 DIAGNOSIS — R519 Headache, unspecified: Secondary | ICD-10-CM | POA: Diagnosis not present

## 2020-03-30 DIAGNOSIS — I6782 Cerebral ischemia: Secondary | ICD-10-CM | POA: Diagnosis not present

## 2020-03-30 LAB — DIFFERENTIAL
Abs Immature Granulocytes: 0.02 10*3/uL (ref 0.00–0.07)
Basophils Absolute: 0 10*3/uL (ref 0.0–0.1)
Basophils Relative: 0 %
Eosinophils Absolute: 0.2 10*3/uL (ref 0.0–0.5)
Eosinophils Relative: 2 %
Immature Granulocytes: 0 %
Lymphocytes Relative: 17 %
Lymphs Abs: 1.7 10*3/uL (ref 0.7–4.0)
Monocytes Absolute: 0.9 10*3/uL (ref 0.1–1.0)
Monocytes Relative: 9 %
Neutro Abs: 7.1 10*3/uL (ref 1.7–7.7)
Neutrophils Relative %: 72 %

## 2020-03-30 LAB — COMPREHENSIVE METABOLIC PANEL
ALT: 13 U/L (ref 0–44)
AST: 17 U/L (ref 15–41)
Albumin: 3.9 g/dL (ref 3.5–5.0)
Alkaline Phosphatase: 79 U/L (ref 38–126)
Anion gap: 9 (ref 5–15)
BUN: 9 mg/dL (ref 8–23)
CO2: 23 mmol/L (ref 22–32)
Calcium: 9.7 mg/dL (ref 8.9–10.3)
Chloride: 108 mmol/L (ref 98–111)
Creatinine, Ser: 0.94 mg/dL (ref 0.44–1.00)
GFR calc Af Amer: >60 mL/min (ref >60)
GFR calc non Af Amer: >60 mL/min (ref >60)
Glucose, Bld: 123 mg/dL — ABNORMAL HIGH (ref 70–99)
Potassium: 3.9 mmol/L (ref 3.5–5.1)
Sodium: 140 mmol/L (ref 135–145)
Total Bilirubin: 0.6 mg/dL (ref 0.3–1.2)
Total Protein: 6.6 g/dL (ref 6.5–8.1)

## 2020-03-30 LAB — CBC
HCT: 41.6 % (ref 36.0–46.0)
Hemoglobin: 13.3 g/dL (ref 12.0–15.0)
MCH: 31 pg (ref 26.0–34.0)
MCHC: 32 g/dL (ref 30.0–36.0)
MCV: 97 fL (ref 80.0–100.0)
Platelets: 285 10*3/uL (ref 150–400)
RBC: 4.29 MIL/uL (ref 3.87–5.11)
RDW: 12.7 % (ref 11.5–15.5)
WBC: 9.9 10*3/uL (ref 4.0–10.5)
nRBC: 0 % (ref 0.0–0.2)

## 2020-03-30 LAB — PROTIME-INR
INR: 1 (ref 0.8–1.2)
Prothrombin Time: 13.2 seconds (ref 11.4–15.2)

## 2020-03-30 LAB — APTT: aPTT: 28 seconds (ref 24–36)

## 2020-03-30 IMAGING — CT CT HEAD W/O CM
4 series · 14 of 47 positions shown, 16 images · non-contrast
Comparison: MRI [DATE].  CT [DATE]

CLINICAL DATA: Follow-up stroke

EXAM:
CT HEAD WITHOUT CONTRAST
TECHNIQUE: Contiguous axial images were obtained from the base of the skull
through the vertex without intravenous contrast.

[Series 3: head without · axial · non-contrast · 0.46mm/px · z∈[-82,+38]mm · 7 of 32 slices shown, 9 images]
[im 4/32  brain]
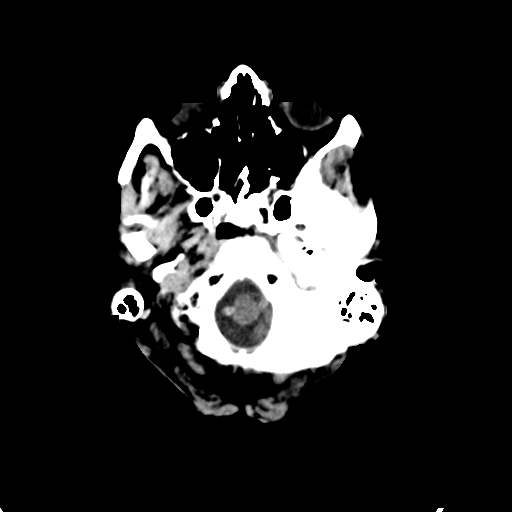
[im 4/32  bone]
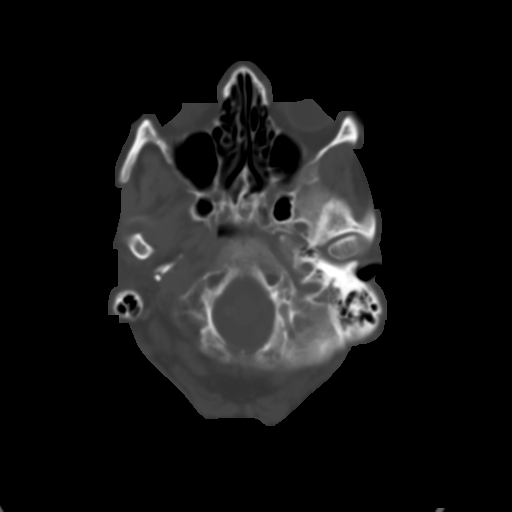
[im 8/32  brain]
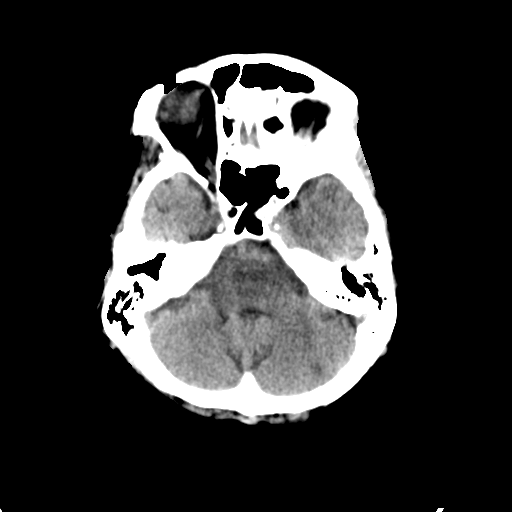
[im 12/32  brain]
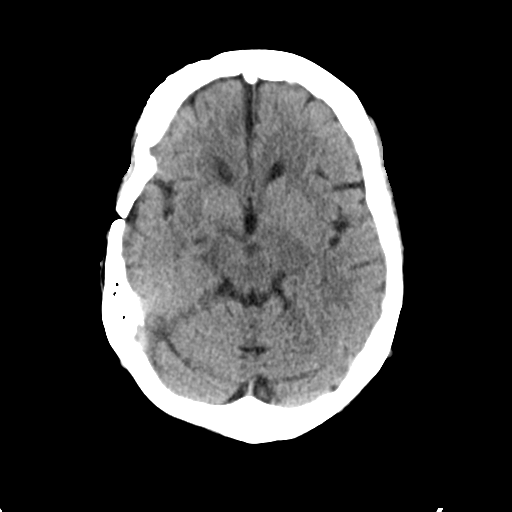
[im 16/32  brain]
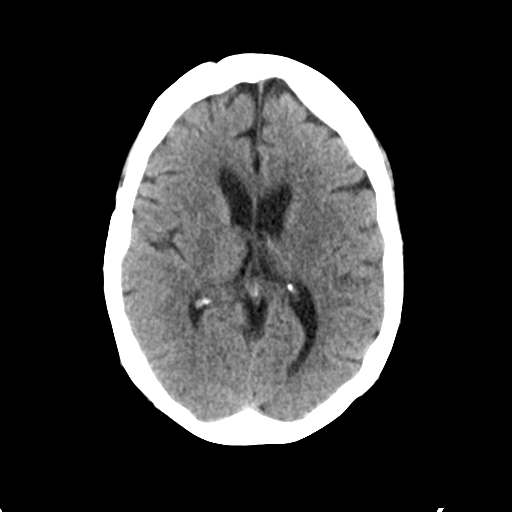
[im 20/32  brain]
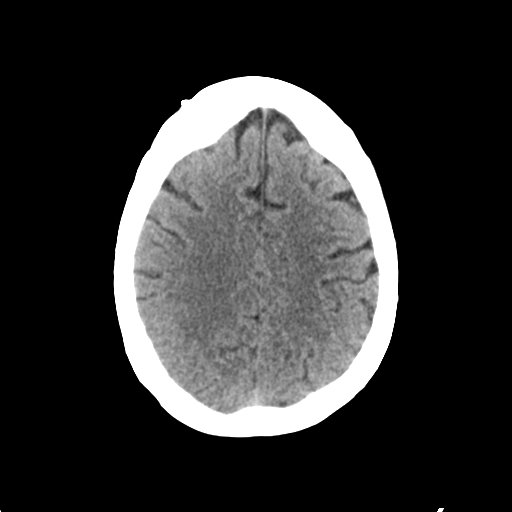
[im 20/32  bone]
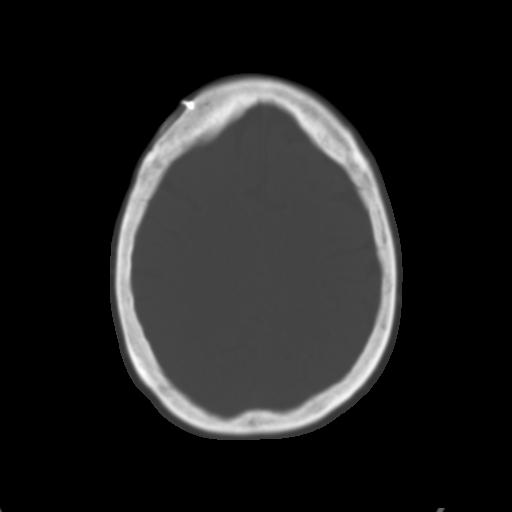
[im 24/32  brain]
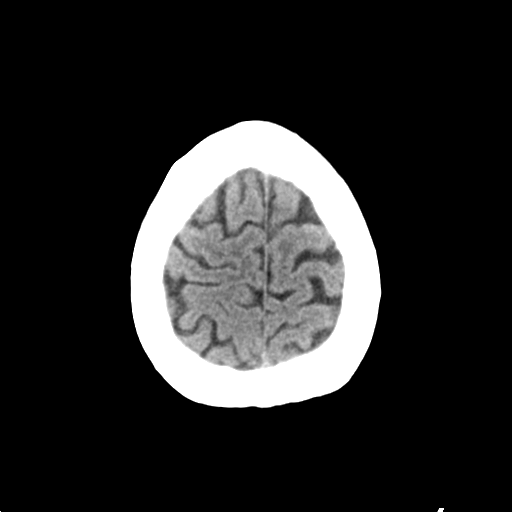
[im 28/32  brain]
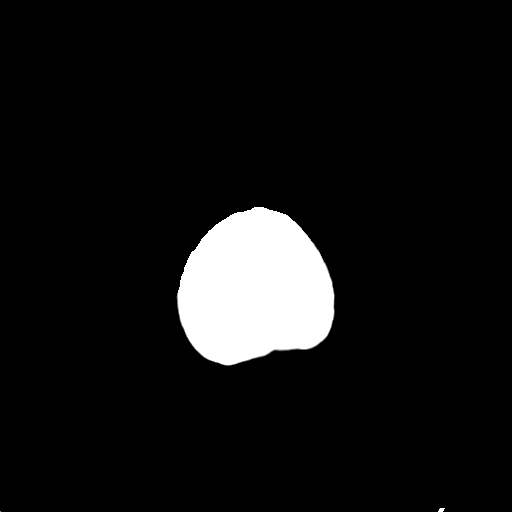

[Series 4: head bone · axial · 0.46mm/px · 1 of 78 slices shown]
[im 8/78  bone]
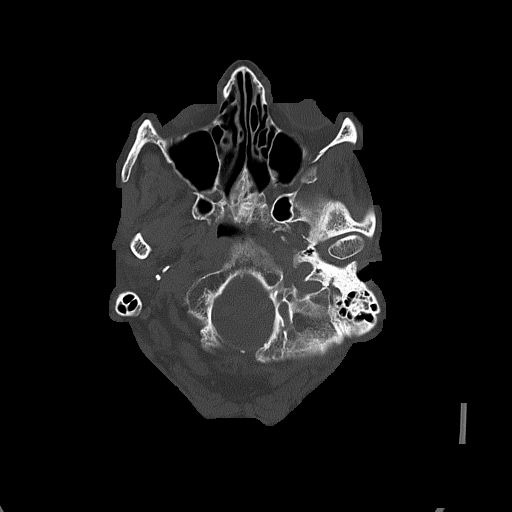

[Series 5: head without cor · coronal · non-contrast · 0.30mm/px · 3 of 70 slices shown]
[im 24/70  brain]
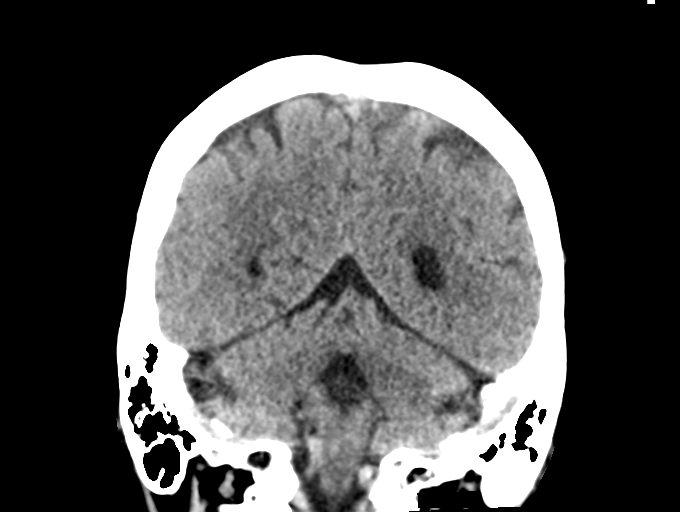
[im 31/70  brain]
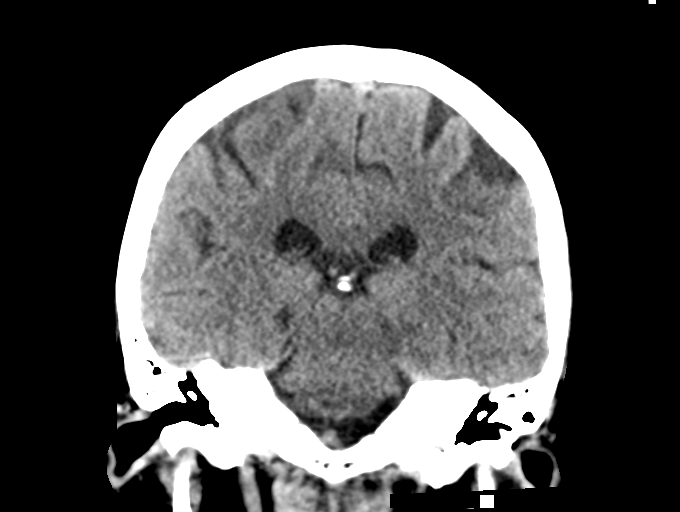
[im 39/70  brain]
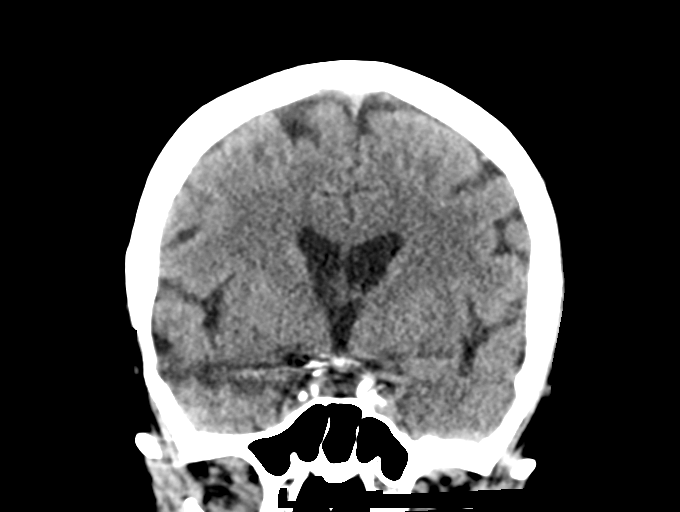

[Series 6: head without sag · sagittal · non-contrast · 0.30mm/px · 3 of 67 slices shown]
[im 23/67  brain]
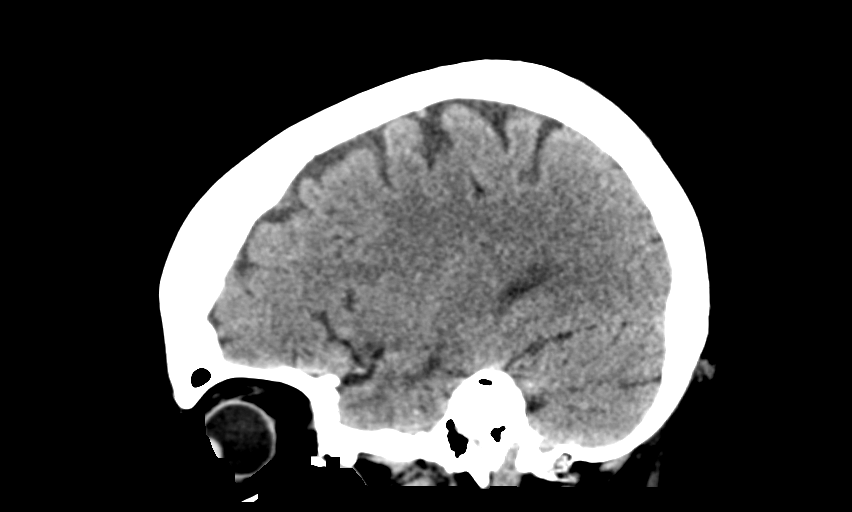
[im 34/67  brain]
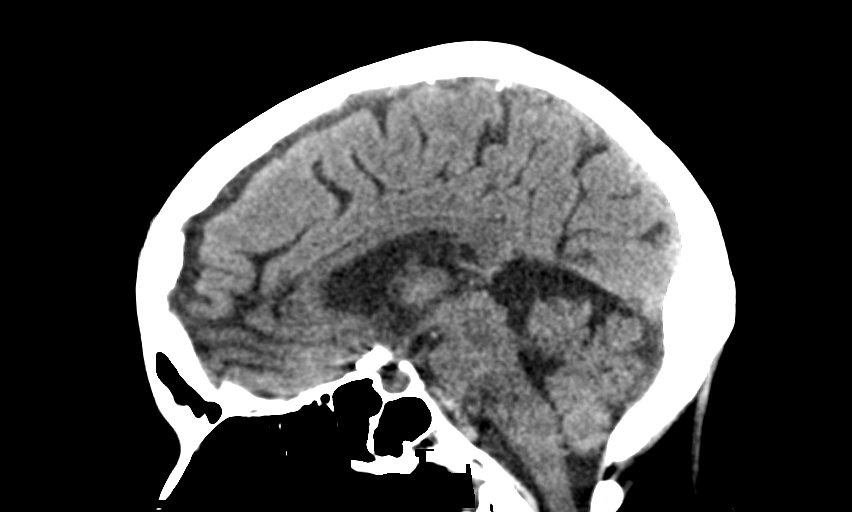
[im 45/67  brain]
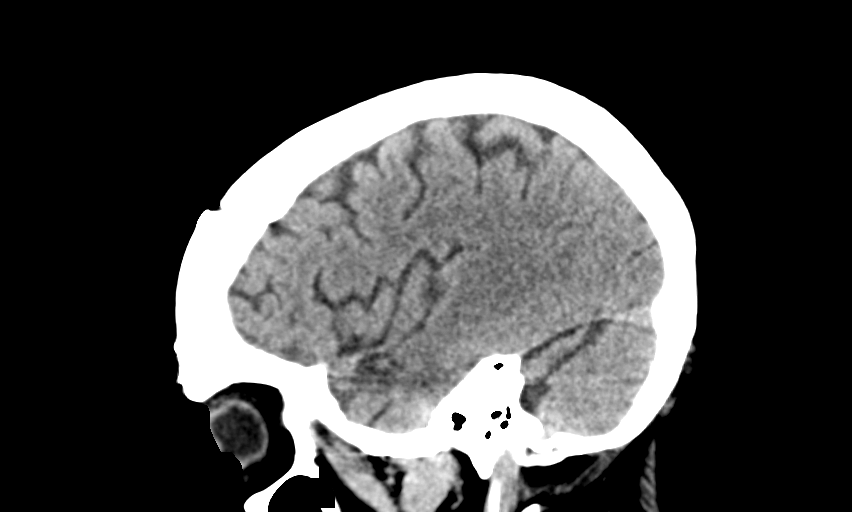

[14 of 47 positions shown; findings below may reference images not displayed]

FINDINGS: Brain: Aneurysm clip in the suprasellar region towards the right.
Mild diffuse cerebral atrophy. Low-attenuation changes in the deep
white matter consistent with small vessel ischemia. No mass-effect
or midline shift. No abnormal extra-axial fluid collections.
Gray-white matter junctions are distinct. Basal cisterns are not
effaced. No acute intracranial hemorrhage.

Vascular: Vascular calcifications. No evidence to suggest recurrent
aneurysm.

Skull: Postoperative right frontotemporal craniotomy with plate and
screw fixations of the bone flap. No acute depressed skull
fractures.

Sinuses/Orbits: No acute finding.

Other: None.
IMPRESSION: 1. No acute intracranial abnormalities.
2. Postoperative right frontotemporal craniotomy with aneurysm clip
in the suprasellar region towards the right.
3. Chronic atrophy and small vessel ischemia.

## 2020-03-30 NOTE — ED Triage Notes (Signed)
Pt arrives POV for eval of headache and altered mental status since yesterday morning. Pt's husband reports she woke up w/ severe blt neck pain and headache yesterday, but not confused. This AM she woke up and thought it was several weeks ago when they were at the beach and tried to start pack. Husband reports she has been confused by routine things all day today. Hx of aneurysm w/ repair 10 years ago. Denies N/V, vision changes. No facial droop, slurred speech, drift or weakness in triage.

## 2020-03-31 ENCOUNTER — Telehealth: Payer: Self-pay | Admitting: Neurology

## 2020-03-31 NOTE — Telephone Encounter (Signed)
Pt's daughter, Otto Herb (on [place]) called asking if her mother was still a patient with [facility]. Pt's symptoms stiff neck, headache, disoriented for 48 hrs, sometimes do not know where she is. Ms. Loletha Grayer took Pt to the ER last night. While waiting has CT Scan. Ms. Loletha Grayer said there was 27 people ahead of her mother. Pt left without being seen. I informed Pt's date of last office visit.

## 2020-03-31 NOTE — Telephone Encounter (Signed)
I called and talk with the daughter.  The patient has had confusion and headache and neck stiffness of the last couple days, no definite fevers.  She did have a CT scan of the head in the ER that was unremarkable.  She has a history of 2 small aneurysms that are 2 mm followed by Dr. Estanislado Pandy.  The patient remains confused, she needs to go back to the emergency room at [facility] and get evaluated.  She may need MRI of the brain and potentially a lumbar puncture.

## 2020-06-21 ENCOUNTER — Ambulatory Visit: Payer: Medicare Other | Admitting: Gastroenterology

## 2020-08-04 ENCOUNTER — Ambulatory Visit: Payer: Medicare Other | Admitting: Gastroenterology

## 2020-08-04 ENCOUNTER — Encounter: Payer: Self-pay | Admitting: Gastroenterology

## 2020-08-04 VITALS — BP 122/80 | Ht 62.0 in | Wt 155.0 lb

## 2020-08-04 DIAGNOSIS — K219 Gastro-esophageal reflux disease without esophagitis: Secondary | ICD-10-CM | POA: Diagnosis not present

## 2020-08-04 DIAGNOSIS — Z800 Family history of malignant neoplasm of digestive organs: Secondary | ICD-10-CM

## 2020-08-04 NOTE — Patient Instructions (Signed)
If you are age 68 or older, your body mass index should be between 23-30. Your Body mass index is 28.35 kg/m. If this is out of the aforementioned range listed, please consider follow up with your Primary Care Provider.  If you are age 59 or younger, your body mass index should be between 19-25. Your Body mass index is 28.35 kg/m. If this is out of the aformentioned range listed, please consider follow up with your Primary Care Provider.    Due to recent changes in healthcare laws, you may see the results of your imaging and laboratory studies on MyChart before your provider has had a chance to review them.  We understand that in some cases there may be results that are confusing or concerning to you. Not all laboratory results come back in the same time frame and the provider may be waiting for multiple results in order to interpret others.  Please give [place] 48 hours in order for your provider to thoroughly review all the results before contacting the office for clarification of your results.

## 2020-08-04 NOTE — Progress Notes (Signed)
Chief Complaint:   Referring Provider:  Bonnita Nasuti, MD      ASSESSMENT AND PLAN;   #1. GERD with eso dysphagia. D/d includes eso stricture, Schatzki's ring, motility disorder, EoE, pill induced esophagitis, r/o esophageal carcinoma/extrinsic lesions or Achalasia.  #2. FH colon ca (dad age 67, Brother with colon ca 44), H/O tubular adenomas. Next colon due 06/2022  #3. IBS-C  Plan: -Continue omeprazole 40 mg p.o. QD, 1/2 hr before breakfast.  -She has cimetidine at home.  Take cimetidine 150mg  po qhs for now. -EGD with eso bx and possibly dil.  Discussed risks and benefits including small but definite risks of eso perforation, bleeding, risks of anesthesia.  The benefits were also discussed. Consent forms given. -Hold fosamax until esophageal dilatation -I have instructed patient to chew foods especially meats and breads well and eat slowly. -Continue with increased water intake. -Minimize nonsteroidals. -D/W pt and pt's husband      HPI:    Amy Glover is a 69 y.o. female  C/O solid food dysphagia mainly in the neck, lower mid chest x 3 to 6 months, getting worse.  She also has dry mouth and dry eyes.  Has been having heartburn intermittently.  No odynophagia.  Has history of longstanding gastroesophageal reflux with hiatal hernia and underwent EGD by Dr. Lyda Jester February 2012 as below  Alt constipation and diarrhea - mostly constipation with pellet-like stools at times associated with abdominal bloating, abdominal discomfort which gets better with defecation.  She has been diagnosed as having IBS previously.  No melena or hematochezia.  No recent weight loss.  No jaundice dark urine or pale stools.  Past GI procedures: EGD by Dr. Lyda Jester 10/09/2010: Esophagitis with oozing, hiatal hernia, gastritis.  Diminutive gastric polyp s/p polypectomy. Bx-fundic gland polyp.  Chronic gastritis.  Distal esophageal biopsies showed acute on chronic esophagitis with  panic keratosis and ulceration.  No Barrett's.  CLO negative.  Colonoscopy 06/29/2019 -colonic polyps s/p polypectomy. Bx- TAs -Mild sigmoid diverticulosis. -Otherwise normal colonoscopy. -Rpt 3 yrs Past Medical History:  Diagnosis Date  . Abnormality of gait 08/08/2015  . Anxiety   . Brain aneurysm    right ACOM  . COPD (chronic obstructive pulmonary disease) ([place])   . Depression   . Dizziness and giddiness 12/07/2013  . Dyslipidemia   . Fall   . Family history of colon cancer   . GERD (gastroesophageal reflux disease)   . History of colon polyps   . Hypertension   . Migraine without aura, without mention of intractable migraine without mention of status migrainosus 12/07/2013  . Osteoarthritis   . Subclavian steal syndrome   . Vitamin B 12 deficiency   . Vitamin D deficiency     Past Surgical History:  Procedure Laterality Date  . BASIL CELL REMOVAL  2015   NOSE  . BRAIN SURGERY  04/20011   right ACOM aneurysm clip  . CESAREAN SECTION  1984  . COLONOSCOPY  04/18/2016   Mild sigmoid diverticulosis. Otherwise normal colonoscopy  . IR GENERIC HISTORICAL  05/10/2016   IR RADIOLOGIST EVAL & MGMT 05/10/2016 [facility]  . IR GENERIC HISTORICAL  05/28/2016   IR ANGIO INTRA EXTRACRAN SEL COM CAROTID INNOMINATE BILAT MOD SED 05/28/2016 Luanne Bras, MD [facility]  . IR GENERIC HISTORICAL  05/28/2016   IR ANGIO VERTEBRAL SEL SUBCLAVIAN INNOMINATE BILAT MOD SED 05/28/2016 Luanne Bras, MD [facility]  . IR GENERIC HISTORICAL  05/28/2016   IR 3D INDEPENDENT WKST 05/28/2016  Luanne Bras, MD [facility]  . IR GENERIC HISTORICAL  06/12/2016   IR RADIOLOGIST EVAL & MGMT 06/12/2016 [facility]  . IR RADIOLOGIST EVAL & MGMT  01/09/2017  . KNEE ARTHROSCOPY     and shoulder  . SHOULDER ARTHROSCOPY W/ ROTATOR CUFF REPAIR Right 2017  . SHOULDER SURGERY  2008  . STENT IN NECK  2009  . TONSILLECTOMY AND ADENOIDECTOMY  1961  . TOTAL KNEE ARTHROPLASTY Right 04/21/2017  .  TOTAL KNEE ARTHROPLASTY Right 04/21/2017   Procedure: RIGHT TOTAL KNEE ARTHROPLASTY;  Surgeon: Vickey Huger, MD;  Location: [facility];  Service: Orthopedics;  Laterality: Right;  . TUBAL LIGATION  1987  . WRIST SURGERY Right 2009    Family History  Problem Relation Age of Onset  . Heart disease Mother   . Hypertension Father   . Cancer Father   . Colon cancer Father 50       METS to LIVER   . Multiple sclerosis Sister   . Seizures Sister   . Colon cancer Brother 61       stage 2   . Colon polyps Brother   . Esophageal cancer Neg Hx   . Rectal cancer Neg Hx   . Stomach cancer Neg Hx     Social History   Tobacco Use  . Smoking status: Former Smoker    Packs/day: 1.00    Years: 35.00    Pack years: 35.00    Types: Cigarettes    Quit date: 03/12/2009    Years since quitting: 11.4  . Smokeless tobacco: Never Used  Vaping Use  . Vaping Use: Never used  Substance Use Topics  . Alcohol use: Yes    Alcohol/week: 0.0 standard drinks    Comment: occasional/once a month per pt  . Drug use: No    Current Outpatient Medications  Medication Sig Dispense Refill  . alendronate (FOSAMAX) 70 MG tablet Take 70 mg by mouth once a week. Take with a full glass of water on an empty stomach.    . ALPRAZolam (XANAX) 0.25 MG tablet Take 0.25-0.5 mg by mouth 2 (two) times daily as needed for anxiety (with meclizine for anxiety and dizziness).    Marland Kitchen aspirin 81 MG tablet Take 162 mg by mouth daily.     Marland Kitchen b complex vitamins tablet Take 1 tablet by mouth daily.    . Calcium Carb-Cholecalciferol (CALCIUM 600+D3 PO) Take by mouth daily. 2 tablets in the morning and 1 tablet at night. Takes total 1800mg  daily.    . carvedilol (COREG) 6.25 MG tablet Take 3.125 mg by mouth 2 (two) times daily with a meal.     . Cholecalciferol (VITAMIN D3) 5000 UNITS TABS Take 1 tablet by mouth daily.    . Estradiol (ESTRACE PO) Take 1 Dose by mouth as needed. TAKES NAME BRAND ONLY    . fluticasone (FLONASE) 50 MCG/ACT  nasal spray Place 1 spray into both nostrils daily. Brand name only.    . furosemide (LASIX) 40 MG tablet Take 40 mg by mouth daily.    Marland Kitchen ibuprofen (ADVIL,MOTRIN) 200 MG tablet Take 200 mg by mouth every 6 (six) hours as needed.    Marland Kitchen lisinopril (PRINIVIL,ZESTRIL) 2.5 MG tablet Take 1 tablet (2.5 mg total) by mouth daily. 90 tablet 3  . meclizine (ANTIVERT) 25 MG tablet Take 25 mg by mouth 3 (three) times daily.    . mometasone-formoterol (DULERA) 100-5 MCG/ACT AERO Inhale 2 puffs into the lungs 2 (two) times daily.    Marland Kitchen  Omega-3 Fatty Acids (FISH OIL) 1000 MG CAPS Take 1,000 mg by mouth 2 (two) times daily.    Marland Kitchen omeprazole (PRILOSEC) 40 MG capsule Take 40 mg by mouth daily.    . potassium chloride SA (K-DUR,KLOR-CON) 20 MEQ tablet Take 20 mEq by mouth daily.     . promethazine (PHENERGAN) 25 MG tablet Take 25 mg by mouth every 8 (eight) hours as needed. For nausea    . rosuvastatin (CRESTOR) 20 MG tablet Take 1 tablet (20 mg total) by mouth daily. 90 tablet 3  . topiramate (TOPAMAX) 100 MG tablet Take 1 tablet (100 mg total) by mouth 2 (two) times daily. 180 tablet 1  . VENTOLIN HFA 108 (90 Base) MCG/ACT inhaler INL 1 PUFF PO Q 4 H PRN    . Vilazodone HCl (VIIBRYD) 40 MG TABS Take 40 mg by mouth daily.    Marland Kitchen zolpidem (AMBIEN) 10 MG tablet Take 5 mg by mouth at bedtime as needed for sleep. For sleep     No current facility-administered medications for this visit.    Allergies  Allergen Reactions  . Estradiol Hives    Can take name brand only  . Sulfa Antibiotics Hives and Nausea Only    Hives  . Atorvastatin Other (See Comments)    Causes muscle pain    Review of Systems:  Constitutional: Denies fever, chills, diaphoresis, appetite change and fatigue.  HEENT: Denies photophobia, eye pain, redness, hearing loss, ear pain, congestion, sore throat, rhinorrhea, sneezing, mouth sores, neck pain, neck stiffness and tinnitus.   Respiratory: Denies SOB, DOE, cough, chest tightness,  and  wheezing.   Cardiovascular: Denies chest pain, palpitations and leg swelling.  Genitourinary: Denies dysuria, urgency, frequency, hematuria, flank pain and difficulty urinating.  Musculoskeletal: Denies myalgias, back pain, joint swelling, arthralgias and gait problem.  Skin: No rash.  Neurological: Denies dizziness, seizures, syncope, weakness, light-headedness, numbness and headaches.  Hematological: Denies adenopathy. Easy bruising, personal or family bleeding history  Psychiatric/Behavioral: Has anxiety or depression     Physical Exam:    BP 122/80   Ht 5\' 2"  (1.575 m)   Wt 155 lb (70.3 kg)   BMI 28.35 kg/m  Wt Readings from Last 3 Encounters:  08/04/20 155 lb (70.3 kg)  03/30/20 160 lb 15 oz (73 kg)  10/01/19 160 lb 6.4 oz (72.8 kg)   Constitutional:  Well-developed, in no acute distress. Psychiatric: Normal mood and affect. Behavior is normal. HEENT: Pupils normal.  Conjunctivae are normal. No scleral icterus. Neck supple.  Cardiovascular: Normal rate, regular rhythm. No edema Pulmonary/chest: Effort normal and breath sounds normal. No wheezing, rales or rhonchi. Abdominal: Soft, nondistended. Nontender. Bowel sounds active throughout. There are no masses palpable. No hepatomegaly. Rectal: Deferred Neurological: Alert and oriented to person place and time. Skin: Skin is warm and dry. No rashes noted.  Data Reviewed: I have personally reviewed following labs and imaging studies  CBC: CBC Latest Ref Rng & Units 03/30/2020 04/22/2017 04/11/2017  WBC 4.0 - 10.5 K/uL 9.9 11.2(H) 9.4  Hemoglobin 12.0 - 15.0 g/dL 13.3 10.8(L) 12.3  Hematocrit 36.0 - 46.0 % 41.6 33.4(L) 38.0  Platelets 150 - 400 K/uL 285 252 277    CMP: CMP Latest Ref Rng & Units 03/30/2020 03/26/2018 04/22/2017  Glucose 70 - 99 mg/dL 123(H) - 128(H)  BUN 8 - 23 mg/dL 9 - 12  Creatinine 0.44 - 1.00 mg/dL 0.94 1.09(H) 1.04(H)  Sodium 135 - 145 mmol/L 140 - 141  Potassium 3.5 - 5.1 mmol/L  3.9 - 3.0(L)  Chloride  98 - 111 mmol/L 108 - 110  CO2 22 - 32 mmol/L 23 - 23  Calcium 8.9 - 10.3 mg/dL 9.7 - 8.7(L)  Total Protein 6.5 - 8.1 g/dL 6.6 - -  Total Bilirubin 0.3 - 1.2 mg/dL 0.6 - -  Alkaline Phos 38 - 126 U/L 79 - -  AST 15 - 41 U/L 17 - -  ALT 0 - 44 U/L 13 - -      Carmell Austria, MD 08/04/2020, 3:14 PM  Cc: Bonnita Nasuti, MD

## 2020-08-07 ENCOUNTER — Other Ambulatory Visit (HOSPITAL_COMMUNITY): Payer: Self-pay | Admitting: Interventional Radiology

## 2020-08-07 DIAGNOSIS — I671 Cerebral aneurysm, nonruptured: Secondary | ICD-10-CM

## 2020-08-10 ENCOUNTER — Encounter: Payer: Medicare Other | Admitting: Gastroenterology

## 2020-08-14 ENCOUNTER — Telehealth: Payer: Self-pay | Admitting: *Deleted

## 2020-08-14 NOTE — Telephone Encounter (Signed)
Primary Cardiologist:Katarina Meda Coffee, MD  Chart reviewed as part of pre-operative protocol coverage. Because of Amy Glover's past medical history and time since last visit, he/she will require a follow-up visit in order to better assess preoperative cardiovascular risk.  Pre-op covering staff: - Please schedule appointment and call patient to inform them. - Please contact requesting surgeon's office via preferred method (i.e, phone, fax) to inform them of need for appointment prior to surgery.  If applicable, this message will also be routed to pharmacy pool and/or primary cardiologist for input on holding anticoagulant/antiplatelet agent as requested below so that this information is available at time of patient's appointment.   Deberah Pelton, NP  08/14/2020, 11:33 AM

## 2020-08-14 NOTE — Telephone Encounter (Signed)
LM2CB-needs appt for clearance 

## 2020-08-14 NOTE — Telephone Encounter (Signed)
° °  [facility] Pre-operative Risk Assessment    HEARTCARE STAFF: - Please ensure there is not already an duplicate clearance open for this procedure. - Under Visit Info/Reason for Call, type in Other and utilize the format Clearance MM/DD/YY or Clearance TBD. Do not use dashes or single digits. - If request is for dental extraction, please clarify the # of teeth to be extracted.  Request for surgical clearance:  1. What type of surgery is being performed? RIGHT RFA L2, 3, 4, 5   2. When is this surgery scheduled? 08/30/20   3. What type of clearance is required (medical clearance vs. Pharmacy clearance to hold med vs. Both)? MEDICAL  4. Are there any medications that need to be held prior to surgery and how long? ASA x 7 DAYS PRIOR   5. Practice name and name of physician performing surgery? [facility]; DR. Faulkner   6. What is the office phone number? [phone]   7.   What is the office fax number? [phone]  8.   Anesthesia type (None, local, MAC, general) ? NOT LISTED   Julaine Hua 08/14/2020, 11:14 AM  _________________________________________________________________   (provider comments below)

## 2020-08-21 NOTE — Telephone Encounter (Signed)
Left another message for patient to call the office and schedule an appointment.

## 2020-08-22 ENCOUNTER — Ambulatory Visit (HOSPITAL_COMMUNITY): Admission: RE | Admit: 2020-08-22 | Payer: Medicare Other | Source: Ambulatory Visit

## 2020-08-22 ENCOUNTER — Ambulatory Visit (HOSPITAL_COMMUNITY)
Admission: RE | Admit: 2020-08-22 | Discharge: 2020-08-22 | Disposition: A | Discharge status: Home / Self Care | Payer: Medicare Other | Source: Ambulatory Visit | Attending: Interventional Radiology | Admitting: Interventional Radiology

## 2020-08-22 ENCOUNTER — Other Ambulatory Visit: Payer: Self-pay

## 2020-08-22 DIAGNOSIS — I671 Cerebral aneurysm, nonruptured: Secondary | ICD-10-CM | POA: Insufficient documentation

## 2020-08-23 ENCOUNTER — Telehealth (HOSPITAL_COMMUNITY): Payer: Self-pay

## 2020-08-23 NOTE — Telephone Encounter (Signed)
Called pt regarding recent mri, no answer, left vm. AW 

## 2020-08-23 NOTE — Telephone Encounter (Signed)
Pt agreed to f/u in 6 months with mri/mra. AW

## 2020-08-30 ENCOUNTER — Encounter: Payer: Self-pay | Admitting: Gastroenterology

## 2020-08-30 ENCOUNTER — Ambulatory Visit (AMBULATORY_SURGERY_CENTER): Payer: Medicare Other | Admitting: Gastroenterology

## 2020-08-30 ENCOUNTER — Other Ambulatory Visit: Payer: Self-pay

## 2020-08-30 VITALS — BP 120/49 | HR 75 | Temp 97.1°F | Resp 20 | Ht 62.0 in | Wt 155.0 lb

## 2020-08-30 DIAGNOSIS — K2980 Duodenitis without bleeding: Secondary | ICD-10-CM

## 2020-08-30 DIAGNOSIS — R1310 Dysphagia, unspecified: Secondary | ICD-10-CM

## 2020-08-30 DIAGNOSIS — K219 Gastro-esophageal reflux disease without esophagitis: Secondary | ICD-10-CM

## 2020-08-30 DIAGNOSIS — K449 Diaphragmatic hernia without obstruction or gangrene: Secondary | ICD-10-CM | POA: Diagnosis not present

## 2020-08-30 DIAGNOSIS — K2970 Gastritis, unspecified, without bleeding: Secondary | ICD-10-CM | POA: Diagnosis not present

## 2020-08-30 MED ORDER — SODIUM CHLORIDE 0.9 % IV SOLN: 500.0000 mL | Freq: Once | INTRAVENOUS | Status: AC

## 2020-08-30 NOTE — Progress Notes (Signed)
pt tolerated well. VSS. awake and to recovery. Report given to RN. Bite block used without trauma. 

## 2020-08-30 NOTE — Progress Notes (Signed)
Called to room to assist during endoscopic procedure.  Patient ID and intended procedure confirmed with present staff. Received instructions for my participation in the procedure from the performing physician.  

## 2020-08-30 NOTE — Op Note (Signed)
[facility] Patient Name: Amy Glover Procedure Date: 08/30/2020 9:40 AM MRN: [id number] Endoscopist: Jackquline Denmark , MD Age: 70 Referring MD:  Date of Birth: 1951/06/29 Gender: Female Account #: [id number] Procedure:                Upper GI endoscopy Indications:              Dysphagia Medicines:                Monitored Anesthesia Care Procedure:                Pre-Anesthesia Assessment:                           - Prior to the procedure, a History and Physical                            was performed, and patient medications and                            allergies were reviewed. The patient's tolerance of                            previous anesthesia was also reviewed. The risks                            and benefits of the procedure and the sedation                            options and risks were discussed with the patient.                            All questions were answered, and informed consent                            was obtained. Prior Anticoagulants: The patient has                            taken no previous anticoagulant or antiplatelet                            agents. ASA Grade Assessment: II - A patient with                            mild systemic disease. After reviewing the risks                            and benefits, the patient was deemed in                            satisfactory condition to undergo the procedure.                           After obtaining informed consent, the endoscope was  passed under direct vision. Throughout the                            procedure, the patient's blood pressure, pulse, and                            oxygen saturations were monitored continuously. The                            Endoscope was introduced through the mouth, and                            advanced to the second part of duodenum. The upper                            GI endoscopy was accomplished without  difficulty.                            The patient tolerated the procedure well. Scope In: Scope Out: Findings:                 The esophagus was mildly tortuous but normal with                            well-defined Z-line at 36 cm. Examined by NBI. Few                            whitish lesions were noted in the distal esophagus                            which could easily be washed. Multiple (over 6)                            biopsies were obtained from proximal mid and distal                            esophagus to rule out eosinophilic esophagitis. It                            was decided, however, to proceed with dilation of                            the entire esophagus. The scope was withdrawn.                            Dilation was performed with a Maloney dilator with                            mild resistance at 50 Fr.                           A small transient hiatal hernia was present.  The entire examined stomach was normal. Biopsies                            were taken with a cold forceps for histology.                           The examined duodenum was normal. Biopsies (4 from                            second portion, 4 from the bulb) for histology were                            taken with a cold forceps to r/o celiac disease. Complications:            No immediate complications. Estimated Blood Loss:     Estimated blood loss: none. Impression:               - Mild presbyesophagus s/p esophageal dilatation.                           - Small hiatal hernia. Recommendation:           - Patient has a contact number available for                            emergencies. The signs and symptoms of potential                            delayed complications were discussed with the                            patient. Return to normal activities tomorrow.                            Written discharge instructions were provided to the                             patient.                           - Post dilatation diet.                           - Continue present medications.                           - Await pathology results.                           - Can resume Fosamax if needed.                           - The findings and recommendations were discussed                            with the patient's family. Lynann Bologna, MD 08/30/2020 10:20:55 AM This  report has been signed electronically.

## 2020-08-30 NOTE — Patient Instructions (Signed)
YOU HAD AN ENDOSCOPIC PROCEDURE TODAY AT THE [facility]:   Refer to the procedure report that was given to you for any specific questions about what was found during the examination.  If the procedure report does not answer your questions, please call your gastroenterologist to clarify.  If you requested that your care partner not be given the details of your procedure findings, then the procedure report has been included in a sealed envelope for you to review at your convenience later.  YOU SHOULD EXPECT: Some feelings of bloating in the abdomen. Passage of more gas than usual.  Walking can help get rid of the air that was put into your GI tract during the procedure and reduce the bloating. If you had a lower endoscopy (such as a colonoscopy or flexible sigmoidoscopy) you may notice spotting of blood in your stool or on the toilet paper. If you underwent a bowel prep for your procedure, you may not have a normal bowel movement for a few days.  Please Note:  You might notice some irritation and congestion in your nose or some drainage.  This is from the oxygen used during your procedure.  There is no need for concern and it should clear up in a day or so.  SYMPTOMS TO REPORT IMMEDIATELY:    Following upper endoscopy (EGD)  Vomiting of blood or coffee ground material  New chest pain or pain under the shoulder blades  Painful or persistently difficult swallowing  New shortness of breath  Fever of 100F or higher  Black, tarry-looking stools  For urgent or emergent issues, a gastroenterologist can be reached at any hour by calling [phone]. Do not use MyChart messaging for urgent concerns.    DIET:  Follow a Post-Dilation Diet (see handout given to you by your recovery nurse): Nothing by mouth until 11:30 am, then Clear Liquids only from 11:30 am to 12:30 pm, then you may proceed to a Soft Diet for the rest of the day today starting at 12:30 pm. You may proceed to your regular  diet tomorrow morning as tolerated.  Drink plenty of fluids but you should avoid alcoholic beverages for 24 hours.  MEDICATIONS: You May resume Fosamax if needed.  ACTIVITY:  You should plan to take it easy for the rest of today and you should NOT DRIVE or use heavy machinery until tomorrow (because of the sedation medicines used during the test).    FOLLOW UP: Our staff will call the number listed on your records 48-72 hours following your procedure to check on you and address any questions or concerns that you may have regarding the information given to you following your procedure. If we do not reach you, we will leave a message.  We will attempt to reach you two times.  During this call, we will ask if you have developed any symptoms of COVID 19. If you develop any symptoms (ie: fever, flu-like symptoms, shortness of breath, cough etc.) before then, please call [phone].  If you test positive for Covid 19 in the 2 weeks post procedure, please call and report this information to [place].    If any biopsies were taken you will be contacted by phone or by letter within the next 1-3 weeks.  Please call us at [phone] if you have not heard about the biopsies in 3 weeks.   Thank you for allowing [place] to provide for your healthcare needs today.   SIGNATURES/CONFIDENTIALITY: You and/or your care partner have  signed paperwork which will be entered into your electronic medical record.  These signatures attest to the fact that that the information above on your After Visit Summary has been reviewed and is understood.  Full responsibility of the confidentiality of this discharge information lies with you and/or your care-partner.

## 2020-09-01 ENCOUNTER — Telehealth: Payer: Self-pay

## 2020-09-01 NOTE — Telephone Encounter (Signed)
  Follow up Call-  Call back number 08/30/2020 06/29/2019  Post procedure Call Back phone  # [phone]  Permission to leave phone message Yes Yes  Some recent data might be hidden     Patient questions:  Do you have a fever, pain , or abdominal swelling? No. Pain Score  0 *  Have you tolerated food without any problems? Yes.    Have you been able to return to your normal activities? Yes.    Do you have any questions about your discharge instructions: Diet   No. Medications  No. Follow up visit  No.  Do you have questions or concerns about your Care? No.  Actions: * If pain score is 4 or above: No action needed, pain <4.  1. Have you developed a fever since your procedure? no  2.   Have you had an respiratory symptoms (SOB or cough) since your procedure? no  3.   Have you tested positive for COVID 19 since your procedure no  4.   Have you had any family members/close contacts diagnosed with the COVID 19 since your procedure?  no   If yes to any of these questions please route to Joylene John, RN and Joella Prince, RN

## 2020-09-11 ENCOUNTER — Encounter: Payer: Self-pay | Admitting: Gastroenterology

## 2020-11-22 ENCOUNTER — Encounter: Payer: Self-pay | Admitting: Cardiology

## 2020-11-24 ENCOUNTER — Telehealth: Payer: Self-pay | Admitting: Gastroenterology

## 2020-11-24 NOTE — Telephone Encounter (Signed)
Left message for pt to call back  °

## 2020-11-27 NOTE — Telephone Encounter (Signed)
Spoke with pt and she is aware.

## 2020-11-27 NOTE — Telephone Encounter (Signed)
Pt states she had dilation done early Jan and was doing really well. Reports she is having difficulty swallowing again and having reflux again. States she was taking tagamet at night and omeprazole during the day. She is having burning in her throat and had some choking last week. Please advise.

## 2020-11-27 NOTE — Telephone Encounter (Signed)
Increase omeprazole to 40 mg p.o. twice daily x 2 weeks, then reduce it to once a day Continue Tagamet nightly Let us know in 2 weeks how she is doing  RG

## 2020-12-28 ENCOUNTER — Ambulatory Visit: Payer: Medicare Other | Admitting: Cardiology

## 2021-01-17 ENCOUNTER — Ambulatory Visit: Payer: Medicare Other | Admitting: Physician Assistant
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
## 2021-01-19 ENCOUNTER — Ambulatory Visit: Payer: Medicare Other | Admitting: Cardiology

## 2021-02-14 ENCOUNTER — Other Ambulatory Visit (HOSPITAL_COMMUNITY): Payer: Self-pay | Admitting: Interventional Radiology

## 2021-02-14 DIAGNOSIS — I671 Cerebral aneurysm, nonruptured: Secondary | ICD-10-CM
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
## 2021-02-23 ENCOUNTER — Encounter (HOSPITAL_BASED_OUTPATIENT_CLINIC_OR_DEPARTMENT_OTHER): Payer: Self-pay | Admitting: Cardiology

## 2021-02-23 ENCOUNTER — Ambulatory Visit (HOSPITAL_BASED_OUTPATIENT_CLINIC_OR_DEPARTMENT_OTHER): Payer: Medicare Other | Admitting: Cardiology

## 2021-02-23 ENCOUNTER — Other Ambulatory Visit: Payer: Self-pay

## 2021-02-23 VITALS — BP 126/82 | HR 82 | Ht 62.0 in | Wt 150.0 lb

## 2021-02-23 DIAGNOSIS — G458 Other transient cerebral ischemic attacks and related syndromes: Secondary | ICD-10-CM

## 2021-02-23 DIAGNOSIS — I428 Other cardiomyopathies: Secondary | ICD-10-CM | POA: Diagnosis not present

## 2021-02-23 DIAGNOSIS — I10 Essential (primary) hypertension: Secondary | ICD-10-CM

## 2021-02-23 DIAGNOSIS — I5181 Takotsubo syndrome: Secondary | ICD-10-CM

## 2021-02-23 DIAGNOSIS — N179 Acute kidney failure, unspecified: Secondary | ICD-10-CM

## 2021-02-23 DIAGNOSIS — E785 Hyperlipidemia, unspecified: Secondary | ICD-10-CM

## 2021-02-23 DIAGNOSIS — J449 Chronic obstructive pulmonary disease, unspecified: Secondary | ICD-10-CM

## 2021-02-23 DIAGNOSIS — I671 Cerebral aneurysm, nonruptured: Secondary | ICD-10-CM

## 2021-02-23 MED ORDER — FUROSEMIDE 20 MG PO TABS: 20.0000 mg | ORAL_TABLET | ORAL | 3 refills | Status: AC

## 2021-02-23 NOTE — Patient Instructions (Addendum)
Medication Instructions:  Your physician has recommended you make the following change in your medication:   1-Decrease Lasix 20 mg by mouth three times a week. Monday, Wednesday and Friday.   *If you need a refill on your cardiac medications before your next appointment, please call your pharmacy*  Lab Work: If you have labs (blood work) drawn today and your tests are completely normal, you will receive your results only by: [place] (if you have MyChart) OR A paper copy in the mail If you have any lab test that is abnormal or we need to change your treatment, we will call you to review the results  Follow-Up: At [facility], you and your health needs are our priority.  As part of our continuing mission to provide you with exceptional heart care, we have created designated Provider Care Teams.  These Care Teams include your primary Cardiologist (physician) and Advanced Practice Providers (APPs -  Physician Assistants and Nurse Practitioners) who all work together to provide you with the care you need, when you need it.  We recommend signing up for the patient portal called "MyChart".  Sign up information is provided on this After Visit Summary.  MyChart is used to connect with patients for Virtual Visits (Telemedicine).  Patients are able to view lab/test results, encounter notes, upcoming appointments, etc.  Non-urgent messages can be sent to your provider as well.   To learn more about what you can do with MyChart, go to [email or web address].    Your next appointment:   6 month(s)  The format for your next appointment:   In Person  Provider:   You may see Dr. Johney Frame or one of the following Advanced Practice Providers on your designated Care Team:   Richardson Dopp, PA-C [place], [place]

## 2021-02-27 ENCOUNTER — Encounter (HOSPITAL_BASED_OUTPATIENT_CLINIC_OR_DEPARTMENT_OTHER): Payer: Self-pay

## 2021-03-05 ENCOUNTER — Encounter (HOSPITAL_COMMUNITY): Payer: Self-pay

## 2021-03-05 ENCOUNTER — Other Ambulatory Visit: Payer: Self-pay

## 2021-03-05 ENCOUNTER — Ambulatory Visit (HOSPITAL_COMMUNITY)
Admission: RE | Admit: 2021-03-05 | Discharge: 2021-03-05 | Disposition: A | Discharge status: Home / Self Care | Payer: Medicare Other | Source: Ambulatory Visit | Attending: Interventional Radiology | Admitting: Interventional Radiology

## 2021-03-05 DIAGNOSIS — I671 Cerebral aneurysm, nonruptured: Secondary | ICD-10-CM | POA: Diagnosis present

## 2021-03-13 ENCOUNTER — Telehealth (HOSPITAL_COMMUNITY): Payer: Self-pay

## 2021-03-13 NOTE — Telephone Encounter (Signed)
Called pt regarding recent imaging, no answer, left vm. AW  

## 2021-03-14 ENCOUNTER — Telehealth (HOSPITAL_COMMUNITY): Payer: Self-pay

## 2021-03-14 NOTE — Telephone Encounter (Signed)
Pt agreed to f/u in 1 year with mri/mra. AW  

## 2021-07-17 ENCOUNTER — Telehealth (HOSPITAL_COMMUNITY): Payer: Self-pay

## 2021-07-17 NOTE — Telephone Encounter (Signed)
Returned pt's call, no answer, left vm. AW  

## 2021-07-18 ENCOUNTER — Telehealth (HOSPITAL_COMMUNITY): Payer: Self-pay

## 2021-07-18 NOTE — Telephone Encounter (Signed)
Returned pt's call, no answer, gave her the fax number for procedure clearance request. AW
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
## 2021-12-24 ENCOUNTER — Ambulatory Visit: Payer: Medicare Other | Admitting: Cardiology
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
## 2022-01-17 ENCOUNTER — Ambulatory Visit: Payer: Medicare Other | Admitting: Physician Assistant

## 2022-01-17 ENCOUNTER — Encounter: Payer: Self-pay | Admitting: Physician Assistant

## 2022-01-17 VITALS — BP 98/78 | HR 92 | Ht 62.0 in | Wt 151.0 lb

## 2022-01-17 DIAGNOSIS — I428 Other cardiomyopathies: Secondary | ICD-10-CM

## 2022-01-17 DIAGNOSIS — I671 Cerebral aneurysm, nonruptured: Secondary | ICD-10-CM | POA: Diagnosis not present

## 2022-01-17 DIAGNOSIS — R0609 Other forms of dyspnea: Secondary | ICD-10-CM | POA: Diagnosis not present

## 2022-01-17 DIAGNOSIS — M179 Osteoarthritis of knee, unspecified: Secondary | ICD-10-CM

## 2022-01-17 DIAGNOSIS — G458 Other transient cerebral ischemic attacks and related syndromes: Secondary | ICD-10-CM

## 2022-01-17 DIAGNOSIS — I10 Essential (primary) hypertension: Secondary | ICD-10-CM

## 2022-01-17 DIAGNOSIS — J449 Chronic obstructive pulmonary disease, unspecified: Secondary | ICD-10-CM
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
## 2022-01-17 NOTE — Patient Instructions (Signed)
Medication Instructions:   Your physician recommends that you continue on your current medications as directed. Please refer to the Current Medication list given to you today.   *If you need a refill on your cardiac medications before your next appointment, please call your pharmacy*   Lab Work: [place]    If you have labs (blood work) drawn today and your tests are completely normal, you will receive your results only by: [place] (if you have MyChart) OR A paper copy in the mail If you have any lab test that is abnormal or we need to change your treatment, we will call you to review the results.   Testing/Procedures: NONE ORDERED  TODAY    Follow-Up: At [facility], you and your health needs are our priority.  As part of our continuing mission to provide you with exceptional heart care, we have created designated Provider Care Teams.  These Care Teams include your primary Cardiologist (physician) and Advanced Practice Providers (APPs -  Physician Assistants and Nurse Practitioners) who all work together to provide you with the care you need, when you need it.  We recommend signing up for the patient portal called "MyChart".  Sign up information is provided on this After Visit Summary.  MyChart is used to connect with patients for Virtual Visits (Telemedicine).  Patients are able to view lab/test results, encounter notes, upcoming appointments, etc.  Non-urgent messages can be sent to your provider as well.   To learn more about what you can do with MyChart, go to [email or web address].    Your next appointment:   You have been referred to PULMONOLOGY   6 month(s)  The format for your next appointment:   In Person  Provider:   Freada Bergeron, MD     Other Instructions   Important Information About Sugar

## 2022-02-05 ENCOUNTER — Encounter: Payer: Self-pay | Admitting: Pulmonary Disease

## 2022-02-05 ENCOUNTER — Ambulatory Visit: Payer: Medicare Other | Admitting: Pulmonary Disease

## 2022-02-05 VITALS — BP 102/68 | HR 82 | Temp 97.5°F | Ht 63.0 in | Wt 152.6 lb

## 2022-02-05 DIAGNOSIS — J449 Chronic obstructive pulmonary disease, unspecified: Secondary | ICD-10-CM | POA: Diagnosis not present

## 2022-02-05 NOTE — Patient Instructions (Signed)
COPD --ORDER pulmonary function tests --CONTINUE Breztri 160 TWO puffs TWICE a day --CONTINUE Albuterol AS NEEDED for shortness of breath or wheezing --REFER to lung cancer screening  Follow-up with me in 3 months with PFTs prior visit

## 2022-02-05 NOTE — Progress Notes (Signed)
Subjective:   PATIENT ID: Amy Glover GENDER: female DOB: Oct 04, 1950, MRN: [id number]   HPI  Chief Complaint  Patient presents with   Consult    Continued and worsening SOB while walking, showering, etc. Knee surgery later this year and want approval.    Reason for Visit: New consult for shortness of breath  [name] is a 71 year old former smoker with HTN, NICM, hx subclavian steal s/p stent in 2009,hx cerebral aneurysm s/p clip in 2011 who presents for evaluation for COPD. She presents with her husband.  She reports long standing shortness of breath for 5-10 years ago which worsens with exertion. She was diagnosed with COPD five years ago and started on Symbicort. However she is having worsening symptoms and was started on Breztri one month ago. Flat surfaces are ok but moving uphill, stairs, standing in shower is difficult. Was evaluated on the 6 MWT but did not need oxygen. Has had some associated dizziness but this is improved on meclizine three times a day. She is planning for a total knee arthroplasty this year.  Social History: Former smoker. Quit in 2010.  I have personally reviewed patient's past medical/family/social history, allergies, current medications.  Past Medical History:  Diagnosis Date   Abnormality of gait 08/08/2015   Anxiety    Brain aneurysm    right ACOM   COPD (chronic obstructive pulmonary disease) (HCC)    Depression    Dizziness and giddiness 12/07/2013   Dyslipidemia    Fall    Family history of colon cancer    GERD (gastroesophageal reflux disease)    History of colon polyps    Hypertension    Migraine without aura, without mention of intractable migraine without mention of status migrainosus 12/07/2013   Osteoarthritis    Subclavian steal syndrome    Vitamin B 12 deficiency    Vitamin D deficiency      Family History  Problem Relation Age of Onset   Heart disease Mother    Hypertension Father    Cancer Father     Colon cancer Father 36       METS to LIVER    Multiple sclerosis Sister    Seizures Sister    Colon cancer Brother 70       stage 2    Colon polyps Brother    Esophageal cancer Neg Hx    Rectal cancer Neg Hx    Stomach cancer Neg Hx      Social History   Occupational History   Occupation: Disability  Tobacco Use   Smoking status: Former    Packs/day: 1.00    Years: 35.00    Total pack years: 35.00    Types: Cigarettes    Quit date: 03/12/2009    Years since quitting: 12.9   Smokeless tobacco: Never  Vaping Use   Vaping Use: Never used  Substance and Sexual Activity   Alcohol use: Yes    Alcohol/week: 0.0 standard drinks of alcohol    Comment: occasional/once a month per pt   Drug use: No   Sexual activity: Not on file    Comment: Married    Allergies  Allergen Reactions   Estradiol Hives    Can take name brand only   Sulfa Antibiotics Hives and Nausea Only    Hives   Atorvastatin Other (See Comments)    Causes muscle pain     Outpatient Medications Prior to Visit  Medication Sig Dispense Refill  albuterol (VENTOLIN HFA) 108 (90 Base) MCG/ACT inhaler Inhale 1 puff into the lungs every 6 (six) hours as needed for wheezing or shortness of breath.     alendronate (FOSAMAX) 70 MG tablet Take 70 mg by mouth once a week. Take with a full glass of water on an empty stomach.     ALPRAZolam (XANAX) 0.25 MG tablet Take 0.25-0.5 mg by mouth 2 (two) times daily as needed for anxiety (with meclizine for anxiety and dizziness).     aspirin 81 MG tablet Take 162 mg by mouth daily.      b complex vitamins tablet Take 1 tablet by mouth daily.     Budeson-Glycopyrrol-Formoterol (BREZTRI AEROSPHERE) 160-9-4.8 MCG/ACT AERO Inhale 2 puffs into the lungs in the morning and at bedtime.     Calcium Carb-Cholecalciferol (CALCIUM 600+D3 PO) Take by mouth daily. 2 tablets in the morning and 1 tablet at night. Takes total '1800mg'$  daily.     carvedilol (COREG) 3.125 MG tablet Take 3.125 mg by  mouth 2 (two) times daily with a meal.     Cholecalciferol (VITAMIN D3) 5000 UNITS TABS Take 1 tablet by mouth daily.     cimetidine (TAGAMET) 200 MG tablet Take 200 mg by mouth every morning.     cyclobenzaprine (FLEXERIL) 10 MG tablet Take 10 mg by mouth 3 (three) times daily as needed for muscle spasms.     estradiol (ESTRACE) 0.1 MG/GM vaginal cream      fluticasone (FLONASE) 50 MCG/ACT nasal spray Place 1 spray into both nostrils daily. Brand name only.     furosemide (LASIX) 20 MG tablet Take 1 tablet (20 mg total) by mouth every Monday, Wednesday, and Friday. 38 tablet 3   HYDROcodone-acetaminophen (NORCO/VICODIN) 5-325 MG tablet Take 1 tablet by mouth every 6 (six) hours as needed for moderate pain.     ibuprofen (ADVIL,MOTRIN) 200 MG tablet Take 200 mg by mouth every 6 (six) hours as needed.     lisinopril (PRINIVIL,ZESTRIL) 2.5 MG tablet Take 1 tablet (2.5 mg total) by mouth daily. 90 tablet 3   meclizine (ANTIVERT) 25 MG tablet Take 25 mg by mouth 3 (three) times daily.     Omega-3 Fatty Acids (FISH OIL) 1000 MG CAPS Take 1,000 mg by mouth 2 (two) times daily.     omeprazole (PRILOSEC) 40 MG capsule Take 40 mg by mouth daily.     potassium chloride SA (K-DUR,KLOR-CON) 20 MEQ tablet Take 20 mEq by mouth daily.      promethazine (PHENERGAN) 25 MG tablet Take 25 mg by mouth every 8 (eight) hours as needed. For nausea     rosuvastatin (CRESTOR) 20 MG tablet Take 1 tablet (20 mg total) by mouth daily. 90 tablet 3   topiramate (TOPAMAX) 100 MG tablet Take 1 tablet (100 mg total) by mouth 2 (two) times daily. 180 tablet 1   Vilazodone HCl (VIIBRYD) 40 MG TABS Take 40 mg by mouth daily.     zolpidem (AMBIEN) 10 MG tablet Take 5 mg by mouth at bedtime as needed for sleep. For sleep     Facility-Administered Medications Prior to Visit  Medication Dose Route Frequency Provider Last Rate Last Admin   0.9 %  sodium chloride infusion  500 mL Intravenous Once Jackquline Denmark, MD        Review of  Systems  Constitutional:  Negative for chills, diaphoresis, fever, malaise/fatigue and weight loss.  HENT:  Negative for congestion.   Respiratory:  Positive for shortness of breath. Negative  for cough, hemoptysis, sputum production and wheezing.   Cardiovascular:  Negative for chest pain, palpitations and leg swelling.     Objective:   Vitals:   02/05/22 1548  BP: 102/68  Pulse: 82  Temp: (!) 97.5 F (36.4 C)  TempSrc: Oral  SpO2: 99%  Weight: 152 lb 9.6 oz (69.2 kg)  Height: '5\' 3"'$  (1.6 m)   SpO2: 99 % (RA)  Physical Exam: General: Well-appearing, no acute distress HENT: Amy Glover, AT Eyes: EOMI, no scleral icterus Respiratory: Clear to auscultation bilaterally.  No crackles, wheezing or rales Cardiovascular: RRR, -M/R/G, no JVD Extremities:-Edema,-tenderness Neuro: AAO x4, CNII-XII grossly intact Psych: Normal mood, normal affect   Data Reviewed:  Imaging: None on file  PFT: None on file  Labs: CBC    Component Value Date/Time   WBC 9.9 03/30/2020 2138   RBC 4.29 03/30/2020 2138   HGB 13.3 03/30/2020 2138   HCT 41.6 03/30/2020 2138   PLT 285 03/30/2020 2138   MCV 97.0 03/30/2020 2138   MCH 31.0 03/30/2020 2138   MCHC 32.0 03/30/2020 2138   RDW 12.7 03/30/2020 2138   LYMPHSABS 1.7 03/30/2020 2138   MONOABS 0.9 03/30/2020 2138   EOSABS 0.2 03/30/2020 2138   BASOSABS 0.0 03/30/2020 2138   Absolute eso 03/30/20 - 200     Assessment & Plan:   Discussion: 71 year old former smoker with HTN, NICM, hx subclavian steal s/p stent in 2009,hx cerebral aneurysm s/p clip in 2011 who presents for evaluation for COPD. Currently on triple therapy with improved symptoms. She is approved for Breztri inhalers however on next reassessment, if Breztri not effective consider switching to Trelegy. Discussed clinical course and management of COPD including bronchodilator regimen and action plan for exacerbation.   Patient also requesting pre-op pulmonary clearance. She is  optimized on current bronchodilator regimen and has excellent exercise tolerance. Post-op evaluation for pulmonary complicated as noted below is low risk.  COPD ORDER pulmonary function tests CONTINUE Breztri 160 TWO puffs TWICE a day CONTINUE Albuterol AS NEEDED for shortness of breath or wheezing REFER to lung cancer screening  Peri-operative Assessment of Pulmonary Risk for Non-Thoracic Surgery:  Left knee surgery  For [facility] Cancer, risk of perioperative pulmonary complications is increased by:  Age greater than 26 years  Presumed COPD   Respiratory complications generally occur in 1% of ASA Class I patients, 5% of ASA Class II and 10% of ASA Class III-IV patients These complications rarely result in mortality and include postoperative pneumonia, atelectasis, pulmonary embolism, ARDS and increased time requiring postoperative mechanical ventilation.  ARISCAT: Low risk (3 pts) 1.6% risk of in-hospital post-op pulmonary complications (composite including respiratory failure, respiratory infection, pleural effusion, atelectasis, pneumothorax, bronchospasm, aspiration pneumonitis)  Overall, I recommend proceeding with the surgery if the risk for respiratory complications are outweighed by the potential benefits. This will need to be discussed between the patient and surgeon.  To reduce risks of respiratory complications, I recommend: --Pre- and post-operative incentive spirometry performed frequently while awake --Avoiding use of pancuronium during anesthesia.  I have discussed the risk factors and recommendations above with the patient.   Health Maintenance Immunization History  Administered Date(s) Administered   Influenza-Unspecified 06/17/2011, 05/29/2015, 05/15/2016   CT Lung Screen - Qualified. Refer to [facility] This Encounter  Procedures   Ambulatory Referral for Lung Cancer Scre    Referral Priority:   Routine    Referral Type:   Consultation     Referral Reason:  Specialty Services Required    Number of Visits Requested:   1   Pulmonary function test    Standing Status:   Future    Standing Expiration Date:   02/06/2023    Order Specific Question:   Where should this test be performed?    Answer:   [facility]    Order Specific Question:   Full PFT: includes the following: basic spirometry, spirometry pre & post bronchodilator, diffusion capacity (DLCO), lung volumes    Answer:   Full PFT  No orders of the defined types were placed in this encounter.   Return in about 3 months (around 05/08/2022).  I have spent a total time of 45-minutes on the day of the appointment reviewing prior documentation, coordinating care and discussing medical diagnosis and plan with the patient/family. Imaging, labs and tests included in this note have been reviewed and interpreted independently by me.  [place], MD [facility] 02/05/2022 6:48 PM  Office Number [phone]

## 2022-02-13 ENCOUNTER — Telehealth (HOSPITAL_COMMUNITY): Payer: Self-pay

## 2022-02-13 NOTE — Telephone Encounter (Signed)
Pt called about form for knee replacement. I told her that we still have a copy of the form if she needs it to be refaxed. Not sure if she needs a new one since it has been over 6 months per her surgeon. Left message for pt to call back or have office to fax a new form. AW

## 2022-03-14 ENCOUNTER — Telehealth (HOSPITAL_COMMUNITY): Payer: Self-pay

## 2022-03-14 NOTE — Telephone Encounter (Signed)
Called to schedule mri/mra, no answer, left vm. AW

## 2022-03-21 ENCOUNTER — Other Ambulatory Visit (HOSPITAL_COMMUNITY): Payer: Self-pay | Admitting: Interventional Radiology

## 2022-03-21 DIAGNOSIS — I671 Cerebral aneurysm, nonruptured: Secondary | ICD-10-CM

## 2022-03-25 ENCOUNTER — Telehealth: Payer: Self-pay | Admitting: Internal Medicine

## 2022-03-25 NOTE — Progress Notes (Signed)
Received request for risk assessment for orthopedic surgery from Dr. Ronnie Derby.  Form was completed and returned via fax with recommendation to continue 81 mg ASA with  no hold.  Per Dr. Estanislado Pandy, pt will need pre-surgical risk assessment from PCP.     Narda Rutherford, AGNP-BC 03/25/2022, 3:18 PM

## 2022-04-24 ENCOUNTER — Other Ambulatory Visit: Payer: Self-pay

## 2022-04-24 DIAGNOSIS — Z122 Encounter for screening for malignant neoplasm of respiratory organs: Secondary | ICD-10-CM

## 2022-04-24 DIAGNOSIS — Z87891 Personal history of nicotine dependence: Secondary | ICD-10-CM

## 2022-04-26 ENCOUNTER — Telehealth: Payer: Self-pay | Admitting: *Deleted

## 2022-04-26 NOTE — Telephone Encounter (Signed)
Please arrange telephone virtual visit on or after October 11th for cardiac clearance.   Should be okay to hold aspirin if needed for 5 to 7 days prior to the procedure.  Patient does not have any prior history of CAD, she has a history of stress-induced cardiomyopathy.

## 2022-04-26 NOTE — Telephone Encounter (Signed)
   Pre-operative Risk Assessment    Patient Name: Amy Glover  DOB: 11-10-1950 MRN: [id number]      Request for Surgical Clearance    Procedure:   LEFT TOTAL KNEE REPLACEMENT  Date of Surgery:  Clearance 08/05/22 PER THE PT HAND WRITTEN NOTES ATTACHED TO THE SURGERY CLEARANCE                                Surgeon:  DR. Lara Mulch Surgeon's Group or Practice Name:  [facility]  Phone number:  [phone] ATTN: Sophronia Simas Fax number:  [phone]   Type of Clearance Requested:   - Medical ; ASA   Type of Anesthesia:  Spinal   Additional requests/questions:    Jiles Prows   04/26/2022, 4:11 PM

## 2022-05-03 NOTE — Telephone Encounter (Signed)
Left message to call back for tele pre op appt 

## 2022-05-06 ENCOUNTER — Telehealth: Payer: Self-pay | Admitting: *Deleted

## 2022-05-06 ENCOUNTER — Ambulatory Visit (HOSPITAL_COMMUNITY)
Admission: RE | Admit: 2022-05-06 | Discharge: 2022-05-06 | Disposition: A | Discharge status: Home / Self Care | Payer: Medicare Other | Source: Ambulatory Visit | Attending: Interventional Radiology | Admitting: Interventional Radiology

## 2022-05-06 DIAGNOSIS — I671 Cerebral aneurysm, nonruptured: Secondary | ICD-10-CM | POA: Diagnosis present

## 2022-05-06 MED ORDER — GADOBUTROL 1 MMOL/ML IV SOLN
7.0000 mL | Freq: Once | INTRAVENOUS | Status: AC | PRN
  Administered 2022-05-06: 7 mL via INTRAVENOUS

## 2022-05-06 NOTE — Telephone Encounter (Signed)
Pt agreeable to plan of care for tele pre op appt 06/06/22. Pt asked for appt to not be to long after 08/05/22. She states the surgeon office wanted 3 months notice if cleared. I explained that the cardiac clearance is good for 2 months. I assured the pt that I will send an FYI to requesting office to let them know of her tele pre op appt 06/06/22. Pt said thank you for the help. Med rec and consent are done.     Patient Consent for Virtual Visit        ELLAINA Glover has provided verbal consent on 05/06/2022 for a virtual visit (video or telephone).   CONSENT FOR VIRTUAL VISIT FOR:  Amy Glover  By participating in this virtual visit I agree to the following:  I hereby voluntarily request, consent and authorize [facility] and its employed or contracted physicians, physician assistants, nurse practitioners or other licensed health care professionals (the Practitioner), to provide me with telemedicine health care services (the "Services") as deemed necessary by the treating Practitioner. I acknowledge and consent to receive the Services by the Practitioner via telemedicine. I understand that the telemedicine visit will involve communicating with the Practitioner through live audiovisual communication technology and the disclosure of certain medical information by electronic transmission. I acknowledge that I have been given the opportunity to request an in-person assessment or other available alternative prior to the telemedicine visit and am voluntarily participating in the telemedicine visit.  I understand that I have the right to withhold or withdraw my consent to the use of telemedicine in the course of my care at any time, without affecting my right to future care or treatment, and that the Practitioner or I may terminate the telemedicine visit at any time. I understand that I have the right to inspect all information obtained and/or recorded in the course of the telemedicine visit  and may receive copies of available information for a reasonable fee.  I understand that some of the potential risks of receiving the Services via telemedicine include:  Delay or interruption in medical evaluation due to technological equipment failure or disruption; Information transmitted may not be sufficient (e.g. poor resolution of images) to allow for appropriate medical decision making by the Practitioner; and/or  In rare instances, security protocols could fail, causing a breach of personal health information.  Furthermore, I acknowledge that it is my responsibility to provide information about my medical history, conditions and care that is complete and accurate to the best of my ability. I acknowledge that Practitioner's advice, recommendations, and/or decision may be based on factors not within their control, such as incomplete or inaccurate data provided by me or distortions of diagnostic images or specimens that may result from electronic transmissions. I understand that the practice of medicine is not an exact science and that Practitioner makes no warranties or guarantees regarding treatment outcomes. I acknowledge that a copy of this consent can be made available to me via my patient portal ([facility]), or I can request a printed copy by calling the office of [facility].    I understand that my insurance will be billed for this visit.   I have read or had this consent read to me. I understand the contents of this consent, which adequately explains the benefits and risks of the Services being provided via telemedicine.  I have been provided ample opportunity to ask questions regarding this consent and the Services and have had my  questions answered to my satisfaction. I give my informed consent for the services to be provided through the use of telemedicine in my medical care

## 2022-05-06 NOTE — Telephone Encounter (Signed)
Left message x 2 for the pt to call back for tele pre op appt

## 2022-05-06 NOTE — Telephone Encounter (Signed)
Pt agreeable to plan of care for tele pre op appt 06/06/22. Pt asked for appt to not be to long after 08/05/22. She states the surgeon office wanted 3 months notice if cleared. I explained that the cardiac clearance is good for 2 months. I assured the pt that I will send an FYI to requesting office to let them know of her tele pre op appt 06/06/22. Pt said thank you for the help. Med rec and consent are done.

## 2022-05-09 ENCOUNTER — Telehealth (HOSPITAL_COMMUNITY): Payer: Self-pay

## 2022-05-09 NOTE — Telephone Encounter (Signed)
Left message for pt to f/u in 1 year cta with Dr. Estanislado Pandy. AW

## 2022-05-15 ENCOUNTER — Ambulatory Visit (INDEPENDENT_AMBULATORY_CARE_PROVIDER_SITE_OTHER): Payer: Medicare Other | Admitting: Pulmonary Disease

## 2022-05-15 ENCOUNTER — Encounter: Payer: Self-pay | Admitting: Pulmonary Disease

## 2022-05-15 DIAGNOSIS — Z87891 Personal history of nicotine dependence: Secondary | ICD-10-CM

## 2022-05-15 DIAGNOSIS — Z122 Encounter for screening for malignant neoplasm of respiratory organs: Secondary | ICD-10-CM

## 2022-05-15 NOTE — Patient Instructions (Signed)
Thank you for participating in the [facility] Screening Program. It was our pleasure to meet you today. We will call you with the results of your scan within the next few days. Your scan will be assigned a Lung RADS category score by the physicians reading the scans.  This Lung RADS score determines follow up scanning.  See below for description of categories, and follow up screening recommendations. We will be in touch to schedule your follow up screening annually or based on recommendations of our providers. We will fax a copy of your scan results to your Primary Care Physician, or the physician who referred you to the program, to ensure they have the results. Please call the office if you have any questions or concerns regarding your scanning experience or results.  Our office number is [phone]. Please speak with Denise Phelps, RN. , or  Denise Buckner RN, They are  our Lung Cancer Screening RN.'s If They are unavailable when you call, Please leave a message on the voice mail. We will return your call at our earliest convenience.This voice mail is monitored several times a day.  Remember, if your scan is normal, we will scan you annually as long as you continue to meet the criteria for the program. (Age 55-77, Current smoker or smoker who has quit within the last 15 years). If you are a smoker, remember, quitting is the single most powerful action that you can take to decrease your risk of lung cancer and other pulmonary, breathing related problems. We know quitting is hard, and we are here to help.  Please let us know if there is anything we can do to help you meet your goal of quitting. If you are a former smoker, congratulations. We are proud of you! Remain smoke free! Remember you can refer friends or family members through the number above.  We will screen them to make sure they meet criteria for the program. Thank you for helping us take better care of you by  participating in Lung Screening.  You can receive free nicotine replacement therapy ( patches, gum or mints) by calling 1-800-QUIT NOW. Please call so we can get you on the path to becoming  a non-smoker. I know it is hard, but you can do this!  Lung RADS Categories:  Lung RADS 1: no nodules or definitely non-concerning nodules.  Recommendation is for a repeat annual scan in 12 months.  Lung RADS 2:  nodules that are non-concerning in appearance and behavior with a very low likelihood of becoming an active cancer. Recommendation is for a repeat annual scan in 12 months.  Lung RADS 3: nodules that are probably non-concerning , includes nodules with a low likelihood of becoming an active cancer.  Recommendation is for a 6-month repeat screening scan. Often noted after an upper respiratory illness. We will be in touch to make sure you have no questions, and to schedule your 6-month scan.  Lung RADS 4 A: nodules with concerning findings, recommendation is most often for a follow up scan in 3 months or additional testing based on our provider's assessment of the scan. We will be in touch to make sure you have no questions and to schedule the recommended 3 month follow up scan.  Lung RADS 4 B:  indicates findings that are concerning. We will be in touch with you to schedule additional diagnostic testing based on our provider's  assessment of the scan.  Other options for assistance in smoking cessation (   As covered by your insurance benefits)  Hypnosis for smoking cessation  [employer] [phone]  Acupuncture for smoking cessation  [facility] [phone]   

## 2022-05-15 NOTE — Progress Notes (Signed)
Shared Decision Making Visit Lung Cancer Screening Program [phone])   Eligibility: Age 71 y.o. Pack Years Smoking History Calculation 41 (# packs/per year x # years smoked) Recent History of coughing up blood  no Unexplained weight loss? no ( >Than 15 pounds within the last 6 months ) Prior History Lung / other cancer yes - squamous cell cancer removed around 2015 -never needed chemo or radiation, has annual dermatology visits  (Diagnosis within the last 5 years already requiring surveillance chest CT Scans). Smoking Status Former Smoker Former Smokers: Years since quit: 13 years  Quit Date: 2010  Visit Components: Discussion included one or more decision making aids. yes Discussion included risk/benefits of screening. yes Discussion included potential follow up diagnostic testing for abnormal scans. yes Discussion included meaning and risk of over diagnosis. yes Discussion included meaning and risk of False Positives. yes Discussion included meaning of total radiation exposure. yes  Counseling Included: Importance of adherence to annual lung cancer LDCT screening. yes Impact of comorbidities on ability to participate in the program. yes Ability and willingness to under diagnostic treatment. yes  Smoking Cessation Counseling: Former Smokers:  Discussed the importance of maintaining cigarette abstinence. yes Diagnosis Code: Personal History of Nicotine Dependence. U20.254 Information about tobacco cessation classes and interventions provided to patient. Yes - reviewed with patient today. Pt reports she knows how to access resources, knows resources will be on AVS. Good follow up with PCP and Pulm.  Patient provided with "ticket" for LDCT Scan. yes Written Order for Lung Cancer Screening with LDCT placed in Epic. Yes (CT Chest Lung Cancer Screening Low Dose W/O CM) [id number] Z12.2-Screening of respiratory organs Z87.891-Personal history of nicotine dependence   Lauraine Rinne,  NP

## 2022-05-16 ENCOUNTER — Encounter: Payer: Self-pay | Admitting: Pulmonary Disease

## 2022-05-16 ENCOUNTER — Ambulatory Visit: Payer: Medicare Other | Admitting: Pulmonary Disease

## 2022-05-16 ENCOUNTER — Ambulatory Visit
Admission: RE | Admit: 2022-05-16 | Discharge: 2022-05-16 | Disposition: A | Discharge status: Home / Self Care | Payer: Medicare Other | Source: Ambulatory Visit | Attending: Internal Medicine | Admitting: Internal Medicine

## 2022-05-16 VITALS — BP 112/68 | HR 84 | Ht 63.0 in | Wt 157.0 lb

## 2022-05-16 DIAGNOSIS — Z122 Encounter for screening for malignant neoplasm of respiratory organs: Secondary | ICD-10-CM

## 2022-05-16 DIAGNOSIS — J449 Chronic obstructive pulmonary disease, unspecified: Secondary | ICD-10-CM

## 2022-05-16 DIAGNOSIS — Z87891 Personal history of nicotine dependence: Secondary | ICD-10-CM

## 2022-05-16 LAB — PULMONARY FUNCTION TEST
DL/VA % pred: 61 %
DL/VA: 2.56 ml/min/mmHg/L
DLCO cor % pred: 57 %
DLCO cor: 10.78 ml/min/mmHg
DLCO unc % pred: 57 %
DLCO unc: 10.78 ml/min/mmHg
FEF 25-75 Post: 0.41 L/sec
FEF 25-75 Pre: 0.41 L/sec
FEF2575-%Change-Post: 0 %
FEF2575-%Pred-Post: 21 %
FEF2575-%Pred-Pre: 21 %
FEV1-%Change-Post: 2 %
FEV1-%Pred-Post: 43 %
FEV1-%Pred-Pre: 42 %
FEV1-Post: 0.95 L
FEV1-Pre: 0.93 L
FEV1FVC-%Change-Post: 1 %
FEV1FVC-%Pred-Pre: 60 %
FEV6-%Change-Post: 0 %
FEV6-%Pred-Post: 72 %
FEV6-%Pred-Pre: 72 %
FEV6-Post: 2 L
FEV6-Pre: 2.01 L
FEV6FVC-%Change-Post: -1 %
FEV6FVC-%Pred-Post: 103 %
FEV6FVC-%Pred-Pre: 104 %
FVC-%Change-Post: 1 %
FVC-%Pred-Post: 69 %
FVC-%Pred-Pre: 68 %
FVC-Post: 2.03 L
FVC-Pre: 2.01 L
Post FEV1/FVC ratio: 47 %
Post FEV6/FVC ratio: 99 %
Pre FEV1/FVC ratio: 46 %
Pre FEV6/FVC Ratio: 100 %
RV % pred: 152 %
RV: 3.23 L
TLC % pred: 115 %
TLC: 5.66 L

## 2022-05-16 NOTE — Patient Instructions (Addendum)
  Severe COPD (FEV1 43%) CONTINUE Breztri 160 TWO puffs TWICE a day CONTINUE Albuterol AS NEEDED for shortness of breath or wheezing Encourage regular exercise including walking five days a week Discussed vaccinations: influenza, COVID, RSV, pneumococcal (UTD) Reviewed CT lung screen. No significant nodules. Will follow-up final report  Follow-up with me in 6 months

## 2022-05-16 NOTE — Progress Notes (Signed)
Subjective:   PATIENT ID: Amy Glover DOB: 12-05-1950, MRN: [id number]   HPI  Chief Complaint  Patient presents with   Follow-up    PFT results    Reason for Visit: Follow-up shortness of breath  [name] is a 71 year old former smoker with HTN, NICM, hx subclavian steal s/p stent in 2009,hx cerebral aneurysm s/p clip in 2011 who presents for follow-up. Husband present.  Initial consult She reports long standing shortness of breath for 5-10 years ago which worsens with exertion. She was diagnosed with COPD five years ago and started on Symbicort. However she is having worsening symptoms and was started on Breztri one month ago. Flat surfaces are ok but moving uphill, stairs, standing in shower is difficult. Was evaluated on the 6 MWT but did not need oxygen. Has had some associated dizziness but this is improved on meclizine three times a day. She is planning for a total knee arthroplasty this year.  05/16/22 Since our last visit she has been compliant with Amy Glover she reports improvement in her shortness of breath. Denies wheezing or cough. Has not had knee surgery yet. Uses a walker due to falls related to dizziness and knee issues.   Social History: Former smoker. Quit in 2010. Double masters. Previously Speech therapy.  Past Medical History:  Diagnosis Date   Abnormality of gait 08/08/2015   Anxiety    Brain aneurysm    right ACOM   COPD (chronic obstructive pulmonary disease) (HCC)    Depression    Dizziness and giddiness 12/07/2013   Dyslipidemia    Fall    Family history of colon Glover    GERD (gastroesophageal reflux disease)    History of colon polyps    Hypertension    Migraine without aura, without mention of intractable migraine without mention of status migrainosus 12/07/2013   Osteoarthritis    Subclavian steal syndrome    Vitamin B 12 deficiency    Vitamin D deficiency      Family History  Problem Relation Age of Onset    Heart disease Mother    Hypertension Father    Glover Father    Colon Glover Father 24       METS to LIVER    Multiple sclerosis Sister    Seizures Sister    Colon Glover Brother 86       stage 2    Colon polyps Brother    Esophageal Glover Neg Hx    Rectal Glover Neg Hx    Stomach Glover Neg Hx      Social History   Occupational History   Occupation: Disability  Tobacco Use   Smoking status: Former    Packs/day: 1.00    Years: 35.00    Total pack years: 35.00    Types: Cigarettes    Quit date: 03/12/2009    Years since quitting: 13.1   Smokeless tobacco: Never  Vaping Use   Vaping Use: Never used  Substance and Sexual Activity   Alcohol use: Yes    Alcohol/week: 0.0 standard drinks of alcohol    Comment: occasional/once a month per pt   Drug use: No   Sexual activity: Not on file    Comment: Married    Allergies  Allergen Reactions   Estradiol Hives    Can take name brand only   Sulfa Antibiotics Hives and Nausea Only    Hives   Atorvastatin Other (See Comments)    Causes muscle  pain     Outpatient Medications Prior to Visit  Medication Sig Dispense Refill   albuterol (VENTOLIN HFA) 108 (90 Base) MCG/ACT inhaler Inhale 1 puff into the lungs every 6 (six) hours as needed for wheezing or shortness of breath.     alendronate (FOSAMAX) 70 MG tablet Take 70 mg by mouth once a week. Take with a full glass of water on an empty stomach.     ALPRAZolam (XANAX) 0.25 MG tablet Take 0.25-0.5 mg by mouth 2 (two) times daily as needed for anxiety (with meclizine for anxiety and dizziness).     aspirin 81 MG tablet Take 162 mg by mouth daily.      b complex vitamins tablet Take 1 tablet by mouth daily.     Budeson-Glycopyrrol-Formoterol (BREZTRI AEROSPHERE) 160-9-4.8 MCG/ACT AERO Inhale 2 puffs into the lungs in the morning and at bedtime.     Calcium Carb-Cholecalciferol (CALCIUM 600+D3 PO) Take by mouth daily. 2 tablets in the morning and 1 tablet at night. Takes total  '1800mg'$  daily.     carvedilol (COREG) 3.125 MG tablet Take 3.125 mg by mouth 2 (two) times daily with a meal.     Cholecalciferol (VITAMIN D3) 5000 UNITS TABS Take 1 tablet by mouth daily.     cimetidine (TAGAMET) 200 MG tablet Take 200 mg by mouth every morning.     cyclobenzaprine (FLEXERIL) 10 MG tablet Take 10 mg by mouth 3 (three) times daily as needed for muscle spasms.     estradiol (ESTRACE) 0.1 MG/GM vaginal cream      fluticasone (FLONASE) 50 MCG/ACT nasal spray Place 1 spray into both nostrils daily. Brand name only.     furosemide (LASIX) 20 MG tablet Take 1 tablet (20 mg total) by mouth every Monday, Wednesday, and Friday. 38 tablet 3   HYDROcodone-acetaminophen (NORCO/VICODIN) 5-325 MG tablet Take 1 tablet by mouth every 6 (six) hours as needed for moderate pain.     ibuprofen (ADVIL,MOTRIN) 200 MG tablet Take 200 mg by mouth every 6 (six) hours as needed.     lisinopril (PRINIVIL,ZESTRIL) 2.5 MG tablet Take 1 tablet (2.5 mg total) by mouth daily. 90 tablet 3   meclizine (ANTIVERT) 25 MG tablet Take 25 mg by mouth 3 (three) times daily.     Omega-3 Fatty Acids (FISH OIL) 1000 MG CAPS Take 1,000 mg by mouth 2 (two) times daily.     omeprazole (PRILOSEC) 40 MG capsule Take 40 mg by mouth daily.     potassium chloride SA (K-DUR,KLOR-CON) 20 MEQ tablet Take 20 mEq by mouth daily.      promethazine (PHENERGAN) 25 MG tablet Take 25 mg by mouth every 8 (eight) hours as needed. For nausea     rosuvastatin (CRESTOR) 20 MG tablet Take 1 tablet (20 mg total) by mouth daily. 90 tablet 3   topiramate (TOPAMAX) 100 MG tablet Take 1 tablet (100 mg total) by mouth 2 (two) times daily. 180 tablet 1   Vilazodone HCl (VIIBRYD) 40 MG TABS Take 40 mg by mouth daily.     zolpidem (AMBIEN) 10 MG tablet Take 5 mg by mouth at bedtime as needed for sleep. For sleep     Facility-Administered Medications Prior to Visit  Medication Dose Route Frequency Provider Last Rate Last Admin   0.9 %  sodium chloride  infusion  500 mL Intravenous Once Jackquline Denmark, MD        Review of Systems  Constitutional:  Negative for chills, diaphoresis, fever, malaise/fatigue and weight  loss.  HENT:  Negative for congestion.   Respiratory:  Positive for shortness of breath. Negative for cough, hemoptysis, sputum production and wheezing.   Cardiovascular:  Negative for chest pain, palpitations and leg swelling.     Objective:   Vitals:   05/16/22 1458  BP: 112/68  Pulse: 84  SpO2: 96%  Weight: 157 lb (71.2 kg)  Height: '5\' 3"'$  (1.6 m)   SpO2: 96 % O2 Device: None (Room air)  Physical Exam: General: Well-appearing, no acute distress HENT: [place], AT Eyes: EOMI, no scleral icterus Respiratory: Clear to auscultation bilaterally.  No crackles, wheezing or rales Cardiovascular: RRR, -M/R/G, no JVD Extremities:-Edema,-tenderness Neuro: AAO x4, CNII-XII grossly intact Psych: Normal mood, normal affect   Data Reviewed:  Imaging: None on file  PFT: 05/16/22 FVC 2.01 (68%) FEV1 0393 (42%) Ratio 46  TLC 115% RV 152% DLCO 57% Interpretation: Severe obstructive defect with air trapping and reduced DLCO   Labs: CBC    Component Value Date/Time   WBC 9.9 03/30/2020 2138   RBC 4.29 03/30/2020 2138   HGB 13.3 03/30/2020 2138   HCT 41.6 03/30/2020 2138   PLT 285 03/30/2020 2138   MCV 97.0 03/30/2020 2138   MCH 31.0 03/30/2020 2138   MCHC 32.0 03/30/2020 2138   RDW 12.7 03/30/2020 2138   LYMPHSABS 1.7 03/30/2020 2138   MONOABS 0.9 03/30/2020 2138   EOSABS 0.2 03/30/2020 2138   BASOSABS 0.0 03/30/2020 2138   Absolute eso 03/30/20 - 200     Assessment & Plan:   Discussion: 71 year old Glover former smoker with HTN, NIC, hx subclavian steal s/p stent in 2009, hx cerebral aneurysm s/p clip in 2011 who presents for follow-up. Reviewed PFTs and consistent with emphysema. Improved and controlled symptoms on triple therapy. Discussed clinical course and management of COPD including bronchodilator regimen  and action plan for exacerbation.  Patient previously requested pre-op pulmonary clearance. She is optimized on current bronchodilator regimen and has excellent exercise tolerance. Post-op evaluation for pulmonary complicated as noted below is low risk.  Severe COPD (FEV1 43%) - controlled on triple therapy CONTINUE Breztri 160 TWO puffs TWICE a day CONTINUE Albuterol AS NEEDED for shortness of breath or wheezing Encourage regular exercise including walking five days a week Discussed vaccinations: influenza, COVID, RSV, pneumococcal (UTD) Reviewed CT lung screen. No significant nodules. Will follow-up final report  Peri-operative Assessment of Pulmonary Risk for Non-Thoracic Surgery:  Left knee surgery  For Amy Glover, risk of perioperative pulmonary complications is increased by:  Age greater than 30 years  COPD   Respiratory complications generally occur in 1% of ASA Class I patients, 5% of ASA Class II and 10% of ASA Class III-IV patients These complications rarely result in mortality and include postoperative pneumonia, atelectasis, pulmonary embolism, ARDS and increased time requiring postoperative mechanical ventilation.  ARISCAT: Low risk (3 pts) 1.6% risk of in-hospital post-op pulmonary complications (composite including respiratory failure, respiratory infection, pleural effusion, atelectasis, pneumothorax, bronchospasm, aspiration pneumonitis)  Overall, I recommend proceeding with the surgery if the risk for respiratory complications are outweighed by the potential benefits. This will need to be discussed between the patient and surgeon.  To reduce risks of respiratory complications, I recommend: --Pre- and post-operative incentive spirometry performed frequently while awake --Avoiding use of pancuronium during anesthesia.  I have discussed the risk factors and recommendations above with the patient.   Health Maintenance Immunization History  Administered Date(s)  Administered   Influenza, High Dose Seasonal PF 05/26/2018, 04/27/2019  Influenza-Unspecified 06/17/2011, 05/29/2015, 05/15/2016   Tdap 03/07/2020   CT Lung Screen - Qualified. Refer to Lung Screen Program  No orders of the defined types were placed in this encounter. No orders of the defined types were placed in this encounter.   Return in about 6 months (around 11/14/2022).  I have spent a total time of 32-minutes on the day of the appointment including chart review, data review, collecting history, coordinating care and discussing medical diagnosis and plan with the patient/family. Past medical history, allergies, medications were reviewed. Pertinent imaging, labs and tests included in this note have been reviewed and interpreted independently by me.  Jackson, MD [facility] 05/16/2022 3:23 PM  Office Number [phone]

## 2022-05-17 ENCOUNTER — Other Ambulatory Visit: Payer: Self-pay

## 2022-05-17 DIAGNOSIS — Z122 Encounter for screening for malignant neoplasm of respiratory organs: Secondary | ICD-10-CM

## 2022-05-17 DIAGNOSIS — Z87891 Personal history of nicotine dependence: Secondary | ICD-10-CM

## 2022-06-06 ENCOUNTER — Ambulatory Visit: Payer: Medicare Other | Attending: Cardiology | Admitting: Physician Assistant

## 2022-06-06 DIAGNOSIS — Z01810 Encounter for preprocedural cardiovascular examination: Secondary | ICD-10-CM

## 2022-06-06 NOTE — Progress Notes (Signed)
Virtual Visit via Telephone Note   Because of Amy Glover's co-morbid illnesses, she is at least at moderate risk for complications without adequate follow up.  This format is felt to be most appropriate for this patient at this time.  The patient did not have access to video technology/had technical difficulties with video requiring transitioning to audio format only (telephone).  All issues noted in this document were discussed and addressed.  No physical exam could be performed with this format.  Please refer to the patient's chart for her consent to telehealth for [facility].  Evaluation Performed:  Preoperative cardiovascular risk assessment _____________   Date:  06/06/2022   Patient ID:  Amy Glover, DOB [birth year], MRN [id number] Patient Location:  Home Provider location:   Office  Primary Care Provider:  Bonnita Nasuti, MD Primary Cardiologist:  Freada Bergeron, MD  Chief Complaint / Patient Profile   71 y.o. y/o female with a h/o nonischemic cardiomyopathy, subclavian steal syndrome status post stenting in 2009, COPD, brain aneurysm with chronic dizziness who is pending left total knee replacement and presents today for telephonic preoperative cardiovascular risk assessment.  Past Medical History    Past Medical History:  Diagnosis Date   Abnormality of gait 08/08/2015   Anxiety    Brain aneurysm    right ACOM   COPD (chronic obstructive pulmonary disease) ([place])    Depression    Dizziness and giddiness 12/07/2013   Dyslipidemia    Fall    Family history of colon cancer    GERD (gastroesophageal reflux disease)    History of colon polyps    Hypertension    Migraine without aura, without mention of intractable migraine without mention of status migrainosus 12/07/2013   Osteoarthritis    Subclavian steal syndrome    Vitamin B 12 deficiency    Vitamin D deficiency    Past Surgical History:  Procedure Laterality Date   BASIL CELL REMOVAL   2015   NOSE   BRAIN SURGERY  04/20011   right ACOM aneurysm clip   CESAREAN SECTION  1984   COLONOSCOPY  04/18/2016   Mild sigmoid diverticulosis. Otherwise normal colonoscopy   IR GENERIC HISTORICAL  05/10/2016   IR RADIOLOGIST EVAL & MGMT 05/10/2016 [facility]   IR GENERIC HISTORICAL  05/28/2016   IR ANGIO INTRA EXTRACRAN SEL COM CAROTID INNOMINATE BILAT MOD SED 05/28/2016 Amy Bras, MD [facility]   IR GENERIC HISTORICAL  05/28/2016   IR ANGIO VERTEBRAL SEL SUBCLAVIAN INNOMINATE BILAT MOD SED 05/28/2016 Amy Bras, MD [facility]   IR GENERIC HISTORICAL  05/28/2016   IR 3D INDEPENDENT WKST 05/28/2016 Amy Bras, MD [facility]   IR GENERIC HISTORICAL  06/12/2016   IR RADIOLOGIST EVAL & MGMT 06/12/2016 [facility]   IR RADIOLOGIST EVAL & MGMT  01/09/2017   KNEE ARTHROSCOPY     and shoulder   SHOULDER ARTHROSCOPY W/ ROTATOR CUFF REPAIR Right 2017   SHOULDER SURGERY  2008   STENT IN NECK  2009   TONSILLECTOMY AND ADENOIDECTOMY  1961   TOTAL KNEE ARTHROPLASTY Right 04/21/2017   TOTAL KNEE ARTHROPLASTY Right 04/21/2017   Procedure: RIGHT TOTAL KNEE ARTHROPLASTY;  Surgeon: Vickey Huger, MD;  Location: [place];  Service: Orthopedics;  Laterality: Right;   TUBAL LIGATION  1987   WRIST SURGERY Right 2009    Allergies  Allergies  Allergen Reactions   Estradiol Hives    Can take name brand only   Sulfa Antibiotics Hives  and Nausea Only    Hives   Atorvastatin Other (See Comments)    Causes muscle pain    History of Present Illness    Amy Glover is a 71 y.o. female who presents via audio/video conferencing for a telehealth visit today.  Pt was last seen in cardiology clinic on 01/17/2022 by Nell Range, PA-C.  At that time Amy Glover was doing well.  The patient is now pending procedure as outlined above. Since her last visit, she feels good without any chest pain or SOB, no arrhythmias. She does have COPD. Knee issues hold her back at times.  She  has no issues with walking or going up a flight of stairs.  She is able to do light and moderate housework.  She also does some yard work.  She enjoys playing with her grandchildren and plays putt and corn hole.    She mentions to me that Dr. Lorre Nick wants to do a block of her general anesthesia.  I agree that less sedation would be more favorable in her case.   She knows to hold her aspirin 7 days prior to the procedure.  Please restart medically safe to do so.    Home Medications    Prior to Admission medications   Medication Sig Start Date End Date Taking? Authorizing Provider  albuterol (VENTOLIN HFA) 108 (90 Base) MCG/ACT inhaler Inhale 1 puff into the lungs every 6 (six) hours as needed for wheezing or shortness of breath.    [provider]  alendronate (FOSAMAX) 70 MG tablet Take 70 mg by mouth once a week. Take with a full glass of water on an empty stomach.    [provider]  ALPRAZolam Duanne Moron) 0.25 MG tablet Take 0.25-0.5 mg by mouth 2 (two) times daily as needed for anxiety (with meclizine for anxiety and dizziness).    [provider]  aspirin 81 MG tablet Take 162 mg by mouth daily.     [provider]  b complex vitamins tablet Take 1 tablet by mouth daily.    [provider]  Budeson-Glycopyrrol-Formoterol (BREZTRI AEROSPHERE) 160-9-4.8 MCG/ACT AERO Inhale 2 puffs into the lungs in the morning and at bedtime.    [provider]  Calcium Carb-Cholecalciferol (CALCIUM 600+D3 PO) Take by mouth daily. 2 tablets in the morning and 1 tablet at night. Takes total '1800mg'$  daily.    [provider]  carvedilol (COREG) 3.125 MG tablet Take 3.125 mg by mouth 2 (two) times daily with a meal.    [provider]  Cholecalciferol (VITAMIN D3) 5000 UNITS TABS Take 1 tablet by mouth daily.    [provider]  cimetidine (TAGAMET) 200 MG tablet Take 200 mg by mouth every morning.    [provider]   cyclobenzaprine (FLEXERIL) 10 MG tablet Take 10 mg by mouth 3 (three) times daily as needed for muscle spasms.    [provider]  estradiol (ESTRACE) 0.1 MG/GM vaginal cream     [provider]  fluticasone (FLONASE) 50 MCG/ACT nasal spray Place 1 spray into both nostrils daily. Brand name only.    [provider]  furosemide (LASIX) 20 MG tablet Take 1 tablet (20 mg total) by mouth every Monday, Wednesday, and Friday. 02/23/21   Freada Bergeron, MD  HYDROcodone-acetaminophen (NORCO/VICODIN) 5-325 MG tablet Take 1 tablet by mouth every 6 (six) hours as needed for moderate pain.    [provider]  ibuprofen (ADVIL,MOTRIN) 200 MG tablet Take 200  mg by mouth every 6 (six) hours as needed.    [provider]  lisinopril (PRINIVIL,ZESTRIL) 2.5 MG tablet Take 1 tablet (2.5 mg total) by mouth daily. 03/09/18   Dorothy Spark, MD  meclizine (ANTIVERT) 25 MG tablet Take 25 mg by mouth 3 (three) times daily. 11/25/13   [provider]  Omega-3 Fatty Acids (FISH OIL) 1000 MG CAPS Take 1,000 mg by mouth 2 (two) times daily.    [provider]  omeprazole (PRILOSEC) 40 MG capsule Take 40 mg by mouth daily.    [provider]  potassium chloride SA (K-DUR,KLOR-CON) 20 MEQ tablet Take 20 mEq by mouth daily.     [provider]  promethazine (PHENERGAN) 25 MG tablet Take 25 mg by mouth every 8 (eight) hours as needed. For nausea    [provider]  rosuvastatin (CRESTOR) 20 MG tablet Take 1 tablet (20 mg total) by mouth daily. 11/05/18   Imogene Burn, PA-C  topiramate (TOPAMAX) 100 MG tablet Take 1 tablet (100 mg total) by mouth 2 (two) times daily. 05/04/18   Kathrynn Ducking, MD  Vilazodone HCl (VIIBRYD) 40 MG TABS Take 40 mg by mouth daily.    [provider]  zolpidem (AMBIEN) 10 MG tablet Take 5 mg by mouth at bedtime as needed for sleep. For sleep    [provider]    Physical Exam     Vital Signs:  PAMI WOOL does not have vital signs available for review today.  Given telephonic nature of communication, physical exam is limited. AAOx3. NAD. Normal affect.  Speech and respirations are unlabored.  Accessory Clinical Findings    None  Assessment & Plan    1.  Preoperative Cardiovascular Risk Assessment:  Ms. Korenek perioperative risk of a major cardiac event is 0.4% according to the Revised Cardiac Risk Index (RCRI).  Therefore, she is at low risk for perioperative complications.   Her functional capacity is good at 5.62 METs according to the [facility] Activity Status Index (DASI). Recommendations: According to ACC/AHA guidelines, no further cardiovascular testing needed.  The patient may proceed to surgery at acceptable risk.   Antiplatelet and/or Anticoagulation Recommendations: Aspirin can be held for 7 days prior to her surgery.  Please resume Aspirin post operatively when it is felt to be safe from a bleeding standpoint.   A copy of this note will be routed to requesting surgeon.  Time:   Today, I have spent 20 minutes with the patient with telehealth technology discussing medical history, symptoms, and management plan.     Elgie Collard, PA-C  06/06/2022, 1:27 PM

## 2022-08-16 ENCOUNTER — Encounter: Payer: Self-pay | Admitting: Gastroenterology

## 2022-08-30 DIAGNOSIS — E038 Other specified hypothyroidism: Secondary | ICD-10-CM | POA: Diagnosis not present

## 2022-08-30 DIAGNOSIS — E119 Type 2 diabetes mellitus without complications: Secondary | ICD-10-CM | POA: Diagnosis not present

## 2022-08-30 DIAGNOSIS — R69 Illness, unspecified: Secondary | ICD-10-CM | POA: Diagnosis not present

## 2022-08-30 DIAGNOSIS — J449 Chronic obstructive pulmonary disease, unspecified: Secondary | ICD-10-CM | POA: Diagnosis not present

## 2022-08-30 DIAGNOSIS — I10 Essential (primary) hypertension: Secondary | ICD-10-CM | POA: Diagnosis not present

## 2022-08-30 DIAGNOSIS — E782 Mixed hyperlipidemia: Secondary | ICD-10-CM | POA: Diagnosis not present

## 2022-08-30 DIAGNOSIS — E1165 Type 2 diabetes mellitus with hyperglycemia: Secondary | ICD-10-CM | POA: Diagnosis not present

## 2022-08-30 DIAGNOSIS — D518 Other vitamin B12 deficiency anemias: Secondary | ICD-10-CM | POA: Diagnosis not present

## 2022-08-30 DIAGNOSIS — M810 Age-related osteoporosis without current pathological fracture: Secondary | ICD-10-CM | POA: Diagnosis not present

## 2022-08-30 DIAGNOSIS — E559 Vitamin D deficiency, unspecified: Secondary | ICD-10-CM | POA: Diagnosis not present

## 2022-08-30 DIAGNOSIS — M150 Primary generalized (osteo)arthritis: Secondary | ICD-10-CM | POA: Diagnosis not present

## 2022-09-02 DIAGNOSIS — Z79899 Other long term (current) drug therapy: Secondary | ICD-10-CM | POA: Diagnosis not present

## 2022-09-02 DIAGNOSIS — R0602 Shortness of breath: Secondary | ICD-10-CM | POA: Diagnosis not present

## 2022-09-02 DIAGNOSIS — D518 Other vitamin B12 deficiency anemias: Secondary | ICD-10-CM | POA: Diagnosis not present

## 2022-09-02 DIAGNOSIS — J449 Chronic obstructive pulmonary disease, unspecified: Secondary | ICD-10-CM | POA: Diagnosis not present

## 2022-09-02 DIAGNOSIS — M810 Age-related osteoporosis without current pathological fracture: Secondary | ICD-10-CM | POA: Diagnosis not present

## 2022-09-02 DIAGNOSIS — E038 Other specified hypothyroidism: Secondary | ICD-10-CM | POA: Diagnosis not present

## 2022-09-02 DIAGNOSIS — E782 Mixed hyperlipidemia: Secondary | ICD-10-CM | POA: Diagnosis not present

## 2022-09-02 DIAGNOSIS — E1165 Type 2 diabetes mellitus with hyperglycemia: Secondary | ICD-10-CM | POA: Diagnosis not present

## 2022-09-02 DIAGNOSIS — M150 Primary generalized (osteo)arthritis: Secondary | ICD-10-CM | POA: Diagnosis not present

## 2022-09-02 DIAGNOSIS — F331 Major depressive disorder, recurrent, moderate: Secondary | ICD-10-CM | POA: Diagnosis not present

## 2022-09-02 DIAGNOSIS — E559 Vitamin D deficiency, unspecified: Secondary | ICD-10-CM | POA: Diagnosis not present

## 2022-09-02 DIAGNOSIS — I10 Essential (primary) hypertension: Secondary | ICD-10-CM | POA: Diagnosis not present

## 2022-09-03 DIAGNOSIS — R011 Cardiac murmur, unspecified: Secondary | ICD-10-CM | POA: Diagnosis not present

## 2022-09-04 DIAGNOSIS — R0989 Other specified symptoms and signs involving the circulatory and respiratory systems: Secondary | ICD-10-CM | POA: Diagnosis not present

## 2022-09-04 DIAGNOSIS — I6523 Occlusion and stenosis of bilateral carotid arteries: Secondary | ICD-10-CM | POA: Diagnosis not present

## 2022-09-06 DIAGNOSIS — I70223 Atherosclerosis of native arteries of extremities with rest pain, bilateral legs: Secondary | ICD-10-CM | POA: Diagnosis not present

## 2022-09-10 DIAGNOSIS — I77811 Abdominal aortic ectasia: Secondary | ICD-10-CM | POA: Diagnosis not present

## 2022-09-19 DIAGNOSIS — J449 Chronic obstructive pulmonary disease, unspecified: Secondary | ICD-10-CM | POA: Diagnosis not present

## 2022-10-24 DIAGNOSIS — D518 Other vitamin B12 deficiency anemias: Secondary | ICD-10-CM | POA: Diagnosis not present

## 2022-10-24 DIAGNOSIS — M150 Primary generalized (osteo)arthritis: Secondary | ICD-10-CM | POA: Diagnosis not present

## 2022-10-24 DIAGNOSIS — M810 Age-related osteoporosis without current pathological fracture: Secondary | ICD-10-CM | POA: Diagnosis not present

## 2022-10-24 DIAGNOSIS — E559 Vitamin D deficiency, unspecified: Secondary | ICD-10-CM | POA: Diagnosis not present

## 2022-10-24 DIAGNOSIS — E782 Mixed hyperlipidemia: Secondary | ICD-10-CM | POA: Diagnosis not present

## 2022-10-24 DIAGNOSIS — J449 Chronic obstructive pulmonary disease, unspecified: Secondary | ICD-10-CM | POA: Diagnosis not present

## 2022-10-24 DIAGNOSIS — E038 Other specified hypothyroidism: Secondary | ICD-10-CM | POA: Diagnosis not present

## 2022-10-24 DIAGNOSIS — E119 Type 2 diabetes mellitus without complications: Secondary | ICD-10-CM | POA: Diagnosis not present

## 2022-10-24 DIAGNOSIS — E1165 Type 2 diabetes mellitus with hyperglycemia: Secondary | ICD-10-CM | POA: Diagnosis not present

## 2022-10-24 DIAGNOSIS — I10 Essential (primary) hypertension: Secondary | ICD-10-CM | POA: Diagnosis not present

## 2022-10-24 DIAGNOSIS — R69 Illness, unspecified: Secondary | ICD-10-CM | POA: Diagnosis not present

## 2022-11-05 LAB — LAB REPORT - SCANNED: A1c: 5.3

## 2022-12-05 NOTE — Progress Notes (Signed)
Cardiology Office Note:    Date:  12/06/2022  ID:  Amy Glover, DOB 18-Apr-1951, MRN [id number] PCP: Amy Proffer, MD  [facility] Providers Cardiologist:  Amy Sprague, MD      Patient Profile:   Non-ischemic cardiomyopathy  Dx in 2001; normal stress test and normal cardiac catheterization  ?Tako-tsubo CM TTE 10/15/2019: EF 65-70, no RWMA, GR 1 DD, GLS -19.6, normal RVSF, trivial MR, RAP 3 TTE 05/14/2021 ([facility], [place]): EF 65-70, no RWMA, normal RVSF L subclavian stenosis w steal s/p stenting in 2009 Hx of recurrent syncope  Carotid US 04/21/2015 ([facility], [place]): No significant ICA stenosis, normal left subclavian waveforms Hypertension  Hx of brain aneurysm s/p clipping  Recurrent aneurysm followed by neurosurgery  Chronic dizziness Event monitor 04/2015: Normal monitor Chronic Obstructive Pulmonary Disease      History of Present Illness:   Amy Glover is a 72 y.o. female who returns for f/u on non-ischemic cardiomyopathy. She was last seen in clinic by Amy Crock, PA-C in 12/2021.  She is here today with her husband.  She needs left knee replacement later this month with Dr. Sherlean Glover.  She walks with a walker sometimes.  She is able to walk a block or walk upstairs.  She can wash dishes and do laundry.  She has not had chest pain.  She has shortness of breath related to COPD.  This is stable without change.  She has not had syncope.  She sleeps on an incline chronically.  Her lower extremity edema is stable on furosemide 3 times a week.  Labs from care everywhere reviewed.  In March 2024, potassium was normal at 3.7, creatinine normal at 1.16, ALT normal at 13, hemoglobin normal at 13.2.  Review of Systems  Cardiovascular:        No left subclavian steal symptoms  Gastrointestinal:  Negative for hematochezia.  Genitourinary:  Negative for hematuria.  Neurological:  Positive for dizziness (chronic).        Frequent falls   see HPI    Studies Reviewed:    EKG: NSR, HR 91, normal axis, no ST-T wave changes, QTc 442   Risk Assessment/Calculations:             Physical Exam:   VS:  BP 108/64   Pulse 91   Ht 5' 2.5" (1.588 m)   Wt 155 lb 6.4 oz (70.5 kg)   SpO2 97%   BMI 27.97 kg/m    Wt Readings from Last 3 Encounters:  12/06/22 155 lb 6.4 oz (70.5 kg)  05/16/22 157 lb (71.2 kg)  02/05/22 152 lb 9.6 oz (69.2 kg)    Constitutional:      Appearance: Healthy appearance. Not in distress.  Neck:     Vascular: No carotid bruit. JVD normal.  Pulmonary:     Breath sounds: Normal breath sounds. No wheezing. No rales.  Cardiovascular:     Normal rate. Regular rhythm. Normal S1. Normal S2.      Murmurs: There is no murmur.  Edema:    Peripheral edema absent.  Abdominal:     Palpations: Abdomen is soft.       ASSESSMENT AND PLAN:   Preoperative cardiovascular examination [name]'s perioperative risk of a major cardiac event is 0.9% according to the Revised Cardiac Risk Index (RCRI).  Therefore, she is at low risk for perioperative complications.   Her functional capacity is fair at 4.64 METs according to the [facility]  Activity Status Index (DASI). Recommendations: According to ACC/AHA guidelines, no further cardiovascular testing needed.  The patient may proceed to surgery at acceptable risk.   Antiplatelet and/or Anticoagulation Recommendations: Aspirin can be held for 7 days prior to her surgery.  Please resume Aspirin post operatively when it is felt to be safe from a bleeding standpoint.   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  Ejection fraction has improved to normal.  The most recent echocardiogram in her chart is from 2022 and demonstrated EF 65-70.  She notes that her primary care provider obtained an echocardiogram sometime this year.  We will try to obtain those results for her chart.  She was told her ejection  fraction remains normal.  She continues to take furosemide for lower extremity edema.  Overall, she is NYHA II.  Volume status appears stable.  Continue carvedilol 3.125 mg twice daily, lisinopril 2.5 mg daily, K+ 20 mEq daily, furosemide 40 mg 3 times a week.  Subclavian steal syndrome History of left subclavian stenting in 2009.  She has not had a carotid ultrasound since 2016.  I will obtain a follow-up carotid US.  Hyperlipidemia Goal LDL at least <70.  Continue Crestor 20 mg daily.  Obtain follow-up lipid panel.  HTN (hypertension) Blood pressure is well-controlled.  Continue carvedilol 3.125 mg twice daily, lisinopril 2.5 mg daily.       Dispo:  Return in about 1 year (around 12/06/2023) for Routine Follow Up, w/ Dr. Shari Glover.  Signed, Tereso Newcomer, PA-C

## 2022-12-06 ENCOUNTER — Other Ambulatory Visit: Payer: Self-pay | Admitting: *Deleted

## 2022-12-06 ENCOUNTER — Ambulatory Visit: Payer: Medicare HMO | Attending: Physician Assistant | Admitting: Physician Assistant

## 2022-12-06 ENCOUNTER — Telehealth: Payer: Self-pay

## 2022-12-06 ENCOUNTER — Encounter: Payer: Self-pay | Admitting: Physician Assistant

## 2022-12-06 VITALS — BP 108/64 | HR 91 | Ht 62.5 in | Wt 155.4 lb

## 2022-12-06 DIAGNOSIS — G458 Other transient cerebral ischemic attacks and related syndromes: Secondary | ICD-10-CM

## 2022-12-06 DIAGNOSIS — I428 Other cardiomyopathies: Secondary | ICD-10-CM

## 2022-12-06 DIAGNOSIS — I10 Essential (primary) hypertension: Secondary | ICD-10-CM

## 2022-12-06 DIAGNOSIS — Z01810 Encounter for preprocedural cardiovascular examination: Secondary | ICD-10-CM

## 2022-12-06 DIAGNOSIS — E782 Mixed hyperlipidemia: Secondary | ICD-10-CM | POA: Diagnosis not present

## 2022-12-06 NOTE — Telephone Encounter (Signed)
   Pre-operative Risk Assessment    Patient Name: Amy Glover  DOB: 26-Apr-1951 MRN: [id number]      Request for Surgical Clearance    Procedure:   LEFT TOTAL KNEE REPLACEMENT   Date of Surgery:  Clearance TBD                                 Surgeon:  DR. Georgena Spurling Surgeon's Group or Practice Name:  [facility]  Phone number:  [phone] Fax number:  [phone]   Type of Clearance Requested:   - Medical  - Pharmacy:  Hold Aspirin INSTRUCTIONS  WHEN AND IF NEEDS TO BE HELD   Type of Anesthesia:  Spinal   Additional requests/questions:    SignedMichaelle Copas   12/09/2022, 2:10 PM

## 2022-12-06 NOTE — Assessment & Plan Note (Signed)
History of left subclavian stenting in 2009.  She has not had a carotid ultrasound since 2016.  I will obtain a follow-up carotid US.

## 2022-12-06 NOTE — Assessment & Plan Note (Signed)
Blood pressure is well-controlled.  Continue carvedilol 3.125 mg twice daily, lisinopril 2.5 mg daily.

## 2022-12-06 NOTE — Patient Instructions (Signed)
Medication Instructions:  Your physician recommends that you continue on your current medications as directed. Please refer to the Current Medication list given to you today.  *If you need a refill on your cardiac medications before your next appointment, please call your pharmacy*   Lab Work: None today, when you come for your Carotid US, come fasting, you can have your blood work done at the same time.   LIPID  If you have labs (blood work) drawn today and your tests are completely normal, you will receive your results only by: MyChart Message (if you have MyChart) OR A paper copy in the mail If you have any lab test that is abnormal or we need to change your treatment, we will call you to review the results.   Testing/Procedures: Your physician has requested that you have a carotid duplex. This test is an ultrasound of the carotid arteries in your neck. It looks at blood flow through these arteries that supply the brain with blood. Allow one hour for this exam. There are no restrictions or special instructions.    Follow-Up: At [facility], you and your health needs are our priority.  As part of our continuing mission to provide you with exceptional heart care, we have created designated Provider Care Teams.  These Care Teams include your primary Cardiologist (physician) and Advanced Practice Providers (APPs -  Physician Assistants and Nurse Practitioners) who all work together to provide you with the care you need, when you need it.  We recommend signing up for the patient portal called "MyChart".  Sign up information is provided on this After Visit Summary.  MyChart is used to connect with patients for Virtual Visits (Telemedicine).  Patients are able to view lab/test results, encounter notes, upcoming appointments, etc.  Non-urgent messages can be sent to your provider as well.   To learn more about what you can do with MyChart, go to [email or web address].    Your next  appointment:   12 month(s)  Provider:   Meriam Sprague, MD     Other Instructions

## 2022-12-06 NOTE — Assessment & Plan Note (Signed)
Goal LDL at least <70.  Continue Crestor 20 mg daily.  Obtain follow-up lipid panel.

## 2022-12-06 NOTE — Assessment & Plan Note (Addendum)
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  Ejection fraction has improved to normal.  The most recent echocardiogram in her chart is from 2022 and demonstrated EF 65-70.  She notes that her primary care provider obtained an echocardiogram sometime this year.  We will try to obtain those results for her chart.  She was told her ejection fraction remains normal.  She continues to take furosemide for lower extremity edema.  Overall, she is NYHA II.  Volume status appears stable.  Continue carvedilol 3.125 mg twice daily, lisinopril 2.5 mg daily, K+ 20 mEq daily, furosemide 40 mg 3 times a week.

## 2022-12-06 NOTE — Assessment & Plan Note (Addendum)
Ms. Amy Glover perioperative risk of a major cardiac event is 0.9% according to the Revised Cardiac Risk Index (RCRI).  Therefore, she is at low risk for perioperative complications.   Her functional capacity is fair at 4.64 METs according to the Duke Activity Status Index (DASI). Recommendations: According to ACC/AHA guidelines, no further cardiovascular testing needed.  The patient may proceed to surgery at acceptable risk.   Antiplatelet and/or Anticoagulation Recommendations: Aspirin can be held for 7 days prior to her surgery.  Please resume Aspirin post operatively when it is felt to be safe from a bleeding standpoint.

## 2022-12-09 NOTE — Telephone Encounter (Signed)
   Patient Name: Amy Glover  DOB: 15-Feb-1951 MRN: [id number]  Primary Cardiologist: Meriam Sprague, MD  Chart reviewed as part of pre-operative protocol coverage. Pre-op clearance already addressed by colleagues in earlier phone notes. To summarize recommendations:  -Preoperative cardiovascular examination [name]'s perioperative risk of a major cardiac event is 0.9% according to the Revised Cardiac Risk Index (RCRI).  Therefore, she is at low risk for perioperative complications.   Her functional capacity is fair at 4.64 METs according to the Duke Activity Status Index (DASI). Recommendations: According to ACC/AHA guidelines, no further cardiovascular testing needed.  The patient may proceed to surgery at acceptable risk.   Antiplatelet and/or Anticoagulation Recommendations: Aspirin can be held for 7 days prior to her surgery.  Please resume Aspirin post operatively when it is felt to be safe from a bleeding standpoint.   Will route this bundled recommendation to requesting provider via Epic fax function and remove from pre-op pool. Please call with questions.  Sharlene Dory, PA-C 12/09/2022, 3:31 PM

## 2023-02-17 ENCOUNTER — Ambulatory Visit (HOSPITAL_COMMUNITY): Payer: Medicare HMO

## 2023-02-19 ENCOUNTER — Ambulatory Visit (HOSPITAL_COMMUNITY)
Admission: RE | Admit: 2023-02-19 | Discharge: 2023-02-19 | Disposition: A | Discharge status: Home / Self Care | Payer: Medicare HMO | Source: Ambulatory Visit | Attending: Physician Assistant | Admitting: Physician Assistant

## 2023-02-19 DIAGNOSIS — G458 Other transient cerebral ischemic attacks and related syndromes: Secondary | ICD-10-CM | POA: Diagnosis present

## 2023-02-19 DIAGNOSIS — I428 Other cardiomyopathies: Secondary | ICD-10-CM | POA: Insufficient documentation

## 2023-02-21 ENCOUNTER — Encounter: Payer: Self-pay | Admitting: Physician Assistant

## 2023-02-23 ENCOUNTER — Other Ambulatory Visit: Payer: Self-pay | Admitting: Acute Care

## 2023-02-23 DIAGNOSIS — Z87891 Personal history of nicotine dependence: Secondary | ICD-10-CM

## 2023-02-23 DIAGNOSIS — Z122 Encounter for screening for malignant neoplasm of respiratory organs: Secondary | ICD-10-CM

## 2023-04-24 ENCOUNTER — Telehealth (HOSPITAL_COMMUNITY): Payer: Self-pay

## 2023-04-24 ENCOUNTER — Other Ambulatory Visit (HOSPITAL_COMMUNITY): Payer: Self-pay | Admitting: Interventional Radiology

## 2023-04-24 DIAGNOSIS — I671 Cerebral aneurysm, nonruptured: Secondary | ICD-10-CM

## 2023-04-24 NOTE — Telephone Encounter (Signed)
Called to schedule cta, no answer, left vm. AB 

## 2023-05-07 ENCOUNTER — Encounter (HOSPITAL_COMMUNITY): Payer: Self-pay | Admitting: Interventional Radiology

## 2023-05-12 ENCOUNTER — Ambulatory Visit (HOSPITAL_COMMUNITY)
Admission: RE | Admit: 2023-05-12 | Discharge: 2023-05-12 | Disposition: A | Discharge status: Home / Self Care | Payer: Medicare HMO | Source: Ambulatory Visit | Attending: Interventional Radiology | Admitting: Interventional Radiology

## 2023-05-12 DIAGNOSIS — I671 Cerebral aneurysm, nonruptured: Secondary | ICD-10-CM | POA: Insufficient documentation

## 2023-05-12 MED ORDER — SODIUM CHLORIDE (PF) 0.9 % IJ SOLN
INTRAMUSCULAR | Status: AC
  Filled 2023-05-12: qty 50

## 2023-05-12 MED ORDER — IOHEXOL 350 MG/ML SOLN
75.0000 mL | Freq: Once | INTRAVENOUS | Status: AC | PRN
  Administered 2023-05-12: 75 mL via INTRAVENOUS

## 2023-05-19 ENCOUNTER — Ambulatory Visit
Admission: RE | Admit: 2023-05-19 | Discharge: 2023-05-19 | Disposition: A | Discharge status: Home / Self Care | Payer: Medicare HMO | Source: Ambulatory Visit | Attending: Internal Medicine | Admitting: Internal Medicine

## 2023-05-19 DIAGNOSIS — Z122 Encounter for screening for malignant neoplasm of respiratory organs: Secondary | ICD-10-CM

## 2023-05-19 DIAGNOSIS — Z87891 Personal history of nicotine dependence: Secondary | ICD-10-CM

## 2023-05-28 ENCOUNTER — Other Ambulatory Visit: Payer: Self-pay | Admitting: Acute Care

## 2023-05-28 DIAGNOSIS — Z122 Encounter for screening for malignant neoplasm of respiratory organs: Secondary | ICD-10-CM

## 2023-05-28 DIAGNOSIS — Z87891 Personal history of nicotine dependence: Secondary | ICD-10-CM

## 2023-06-09 ENCOUNTER — Telehealth (HOSPITAL_COMMUNITY): Payer: Self-pay

## 2023-06-09 NOTE — Telephone Encounter (Signed)
Pt agreed to f/u in 2 years with an CTA. AB

## 2023-11-04 ENCOUNTER — Encounter: Payer: Self-pay | Admitting: Pulmonary Disease

## 2023-12-02 ENCOUNTER — Ambulatory Visit: Payer: Medicare HMO | Admitting: Physician Assistant

## 2023-12-16 ENCOUNTER — Other Ambulatory Visit (HOSPITAL_BASED_OUTPATIENT_CLINIC_OR_DEPARTMENT_OTHER): Payer: Self-pay | Admitting: Physical Medicine and Rehabilitation

## 2023-12-16 DIAGNOSIS — M47817 Spondylosis without myelopathy or radiculopathy, lumbosacral region: Secondary | ICD-10-CM

## 2023-12-16 DIAGNOSIS — M4807 Spinal stenosis, lumbosacral region: Secondary | ICD-10-CM

## 2023-12-16 DIAGNOSIS — G894 Chronic pain syndrome: Secondary | ICD-10-CM

## 2023-12-18 ENCOUNTER — Ambulatory Visit (HOSPITAL_BASED_OUTPATIENT_CLINIC_OR_DEPARTMENT_OTHER)
Admission: RE | Admit: 2023-12-18 | Discharge: 2023-12-18 | Discharge status: Home / Self Care | Source: Ambulatory Visit | Attending: Physical Medicine and Rehabilitation | Admitting: Physical Medicine and Rehabilitation

## 2023-12-18 DIAGNOSIS — M47817 Spondylosis without myelopathy or radiculopathy, lumbosacral region: Secondary | ICD-10-CM

## 2023-12-18 DIAGNOSIS — R531 Weakness: Secondary | ICD-10-CM | POA: Diagnosis not present

## 2023-12-18 DIAGNOSIS — G894 Chronic pain syndrome: Secondary | ICD-10-CM

## 2023-12-18 DIAGNOSIS — M542 Cervicalgia: Secondary | ICD-10-CM

## 2023-12-18 DIAGNOSIS — M4807 Spinal stenosis, lumbosacral region: Secondary | ICD-10-CM

## 2024-01-07 ENCOUNTER — Telehealth (HOSPITAL_BASED_OUTPATIENT_CLINIC_OR_DEPARTMENT_OTHER): Payer: Self-pay | Admitting: Internal Medicine

## 2024-01-07 NOTE — Telephone Encounter (Signed)
 Returned patient call about her MRI results that was done 3 weeks ago. I told the patient that [facility] has called rad reading room to get this scan moved up to the list to be read.

## 2024-02-17 ENCOUNTER — Ambulatory Visit: Payer: Self-pay | Attending: Physician Assistant | Admitting: Physician Assistant

## 2024-02-17 ENCOUNTER — Encounter: Payer: Self-pay | Admitting: Physician Assistant

## 2024-02-17 VITALS — BP 119/74 | HR 91 | Ht 62.0 in | Wt 145.0 lb

## 2024-02-17 DIAGNOSIS — G458 Other transient cerebral ischemic attacks and related syndromes: Secondary | ICD-10-CM | POA: Diagnosis not present

## 2024-02-17 DIAGNOSIS — Z01810 Encounter for preprocedural cardiovascular examination: Secondary | ICD-10-CM | POA: Diagnosis not present

## 2024-02-17 DIAGNOSIS — I10 Essential (primary) hypertension: Secondary | ICD-10-CM

## 2024-02-17 DIAGNOSIS — E782 Mixed hyperlipidemia: Secondary | ICD-10-CM

## 2024-02-17 DIAGNOSIS — I428 Other cardiomyopathies: Secondary | ICD-10-CM

## 2024-02-17 DIAGNOSIS — I773 Arterial fibromuscular dysplasia: Secondary | ICD-10-CM

## 2024-02-17 NOTE — Assessment & Plan Note (Signed)
 Ms. Breitenstein perioperative risk of a major cardiac event is 0.9% according to the Revised Cardiac Risk Index (RCRI).  Therefore, she is at low risk for perioperative complications.   Her functional capacity is fair at 4.31 METs according to the Duke Activity Status Index (DASI). Recommendations: According to ACC/AHA guidelines, no further cardiovascular testing needed.  The patient may proceed to surgery at acceptable risk.   Antiplatelet and/or Anticoagulation Recommendations: Aspirin  can be held for 7 days prior to her surgery.  Please resume Aspirin  post operatively when it is felt to be safe from a bleeding standpoint.

## 2024-02-17 NOTE — Assessment & Plan Note (Signed)
 S/p left subclavian stent.  Recent CT with patent left subclavian stent.

## 2024-02-17 NOTE — Progress Notes (Signed)
 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 Hx of recurrent syncope  Carotid US  04/21/2015 ([facility], [place] ): No significant ICA stenosis, normal left subclavian waveforms Carotid US  02/19/2023: No R ICA stenosis, L ICA 1-39 Head and Neck CTA 05/12/23: R cervical ICA FMD; patent L subclavian stent  Hypertension  Chronic kidney disease  Hx of brain aneurysm s/p clipping  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 History of Present Illness Amy Glover is a 73 y.o. female who returns for follow up of DCM. She was last seen in 11/2022.   She has not had chest pain, heaviness, pressure, or tightness. She occasionally experiences shortness of breath with exertion but does not frequently use her emergency inhaler. No episodes of syncope, but she reports frequent falls due to balance issues and uses a walker for stability. She has seen neurosurgery recently and is in need of surgery for spinal stenosis.      ROS-See HPI     Studies Reviewed:  EKG Interpretation Date/Time:  Tuesday February 17 2024 15:37:27 EDT Ventricular Rate:  94 PR Interval:  160 QRS Duration:  80 QT  Interval:  352 QTC Calculation: 440 R Axis:   74  Text Interpretation: Normal sinus rhythm Normal ECG No significant change since last tracing Confirmed by Lelon Hamilton [phone]) on 02/17/2024 4:09:17 PM   Results Labs from primary care via patient portal-personally reviewed 10/16/2023: Total cholesterol 193, HDL 86, triglycerides 106, LDL 86, K 3.6, creatinine 8.24, ALT 32, GFR 37, TSH 2.54  Risk Assessment/Calculations:          Physical Exam:  VS:  BP 119/74   Pulse 91   Ht 5' 2 (1.575 m)   Wt 145 lb (65.8 kg)   SpO2 97%   BMI 26.52 kg/m        Wt Readings from Last 3 Encounters:  02/17/24 145 lb (65.8 kg)  12/06/22 155 lb 6.4 oz (70.5 kg)  05/16/22 157 lb (71.2 kg)    Constitutional:      Appearance: Healthy appearance. Not in distress.  Neck:     Vascular: JVD normal.  Pulmonary:     Breath sounds: Normal breath sounds. No wheezing. No rales.  Cardiovascular:     Normal rate. Regular rhythm.     Murmurs: There is no murmur.  Edema:    Peripheral edema absent.  Abdominal:     Palpations: Abdomen is soft.        Assessment and Plan:    Assessment & Plan Non-ischemic cardiomyopathy (HCC) History of dilated cardiomyopathy in 2001.  Cardiac catheterization at that time demonstrated no CAD.  She had recovery of LV function.  He was suspected she  had stress-induced cardiomyopathy.  Most recent echocardiogram 09/03/2022 with EF 65-70.  Volume status appears stable.  She is NYHA II-IIb. - Continue carvedilol  3.125 mg twice daily. - Continue lisinopril  2.5 mg daily. - Continue Lasix  20 mg every Monday, Wednesday, Friday. - Continue potassium 20 mEq daily. 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  Recent CT with patent left subclavian stent. Primary hypertension Blood pressure controlled.  Continue carvedilol  3.125 mg twice daily, lisinopril  2.5 mg daily Mixed hyperlipidemia Managed by primary care.  She remains on rosuvastatin  20 mg daily. Fibromuscular  dysplasia (HCC) Noted on recent head neck CTA.  Will obtain renal artery ultrasound to rule out renal artery FMD. Preoperative cardiovascular examination Ms. Daman's perioperative risk of a major cardiac event is 0.9% according to the Revised Cardiac Risk Index (RCRI).  Therefore, she is at low risk for perioperative complications.   Her functional capacity is fair at 4.31 METs according to the Duke Activity Status Index (DASI). Recommendations: According to ACC/AHA guidelines, no further cardiovascular testing needed.  The patient may proceed to surgery at acceptable risk.   Antiplatelet and/or Anticoagulation Recommendations: Aspirin  can be held for 7 days prior to her surgery.  Please resume Aspirin  post operatively when it is felt to be safe from a bleeding standpoint.       Dispo:  Return in about 1 year (around 02/16/2025) for Routine Follow Up, w/ Glendia Ferrier, PA-C.  Signed, Glendia Ferrier, PA-C

## 2024-02-17 NOTE — Assessment & Plan Note (Signed)
 Managed by primary care.  She remains on rosuvastatin  20 mg daily.

## 2024-02-17 NOTE — Assessment & Plan Note (Signed)
 History of dilated cardiomyopathy in 2001.  Cardiac catheterization at that time demonstrated no CAD.  She had recovery of LV function.  He was suspected she had stress-induced cardiomyopathy.  Most recent echocardiogram 09/03/2022 with EF 65-70.  Volume status appears stable.  She is NYHA II-IIb. - Continue carvedilol  3.125 mg twice daily. - Continue lisinopril  2.5 mg daily. - Continue Lasix  20 mg every Monday, Wednesday, Friday. - Continue potassium 20 mEq daily.

## 2024-02-17 NOTE — Assessment & Plan Note (Signed)
 Blood pressure controlled.  Continue carvedilol  3.125 mg twice daily, lisinopril  2.5 mg daily

## 2024-02-17 NOTE — Patient Instructions (Signed)
 Medication Instructions:  Your physician recommends that you continue on your current medications as directed. Please refer to the Current Medication list given to you today.  *If you need a refill on your cardiac medications before your next appointment, please call your pharmacy*  Lab Work: None ordered  If you have labs (blood work) drawn today and your tests are completely normal, you will receive your results only by: MyChart Message (if you have MyChart) OR A paper copy in the mail If you have any lab test that is abnormal or we need to change your treatment, we will call you to review the results.  Testing/Procedures: Your physician has requested that you have a renal artery duplex. During this test, an ultrasound is used to evaluate blood flow to the kidneys. Allow one hour for this exam. Do not eat after midnight the day before and avoid carbonated beverages. Take your medications as you usually do.   Follow-Up: At [facility], you and your health needs are our priority.  As part of our continuing mission to provide you with exceptional heart care, our providers are all part of one team.  This team includes your primary Cardiologist (physician) and Advanced Practice Providers or APPs (Physician Assistants and Nurse Practitioners) who all work together to provide you with the care you need, when you need it.  Your next appointment:   1 year(s)  Provider:   Glendia Ferrier, PA-C          We recommend signing up for the patient portal called MyChart.  Sign up information is provided on this After Visit Summary.  MyChart is used to connect with patients for Virtual Visits (Telemedicine).  Patients are able to view lab/test results, encounter notes, upcoming appointments, etc.  Non-urgent messages can be sent to your provider as well.   To learn more about what you can do with MyChart, go to [email or web address].   Other Instructions

## 2024-02-21 ENCOUNTER — Encounter (HOSPITAL_COMMUNITY): Payer: Self-pay | Admitting: Interventional Radiology

## 2024-03-12 ENCOUNTER — Telehealth: Payer: Self-pay

## 2024-03-12 NOTE — Telephone Encounter (Signed)
   Pre-operative Risk Assessment    Patient Name: Amy Glover  DOB: March 01, 1951 MRN: [id number]   Date of last office visit: 02/17/24 GLENDIA FERRIER, PA-C Date of next office visit: NONE   Request for Surgical Clearance    Procedure:  C3-4, C4-5, C5-6, C6-7 ANTERIOR CERVICAL FUSION  Date of Surgery:  Clearance TBD                                Surgeon:  ARLEY SHAUNNA HELLING Surgeon's Group or Practice Name:  [facility] Phone number:  [phone] Fax number:  [phone]   Type of Clearance Requested:   - Medical  - Pharmacy:  Hold Aspirin      Type of Anesthesia:  General    Additional requests/questions:    SignedLucie DELENA Ku   03/12/2024, 12:59 PM

## 2024-03-17 ENCOUNTER — Ambulatory Visit (HOSPITAL_COMMUNITY)
Admission: RE | Admit: 2024-03-17 | Discharge: 2024-03-17 | Disposition: A | Discharge status: Home / Self Care | Source: Ambulatory Visit | Attending: Physician Assistant | Admitting: Physician Assistant

## 2024-03-17 ENCOUNTER — Ambulatory Visit: Payer: Self-pay | Admitting: Physician Assistant

## 2024-03-17 DIAGNOSIS — I773 Arterial fibromuscular dysplasia: Secondary | ICD-10-CM | POA: Diagnosis not present

## 2024-05-14 NOTE — Telephone Encounter (Signed)
 2nd request received.   Will route to preop pool for preop app to review.

## 2024-05-14 NOTE — Telephone Encounter (Signed)
   Patient Name: Amy Glover  DOB: 11-30-1950 MRN: [id number]  Primary Cardiologist: Oneil Parchment, MD  Chart reviewed as part of pre-operative protocol coverage. Pre-op clearance already addressed by colleagues in earlier phone notes. To summarize recommendations:  -Preoperative cardiovascular examination [name]'s perioperative risk of a major cardiac event is 0.9% according to the Revised Cardiac Risk Index (RCRI).  Therefore, she is at low risk for perioperative complications.   Her functional capacity is fair at 4.31 METs according to the Duke Activity Status Index (DASI). Recommendations: According to ACC/AHA guidelines, no further cardiovascular testing needed.  The patient may proceed to surgery at acceptable risk.   Antiplatelet and/or Anticoagulation Recommendations: Aspirin  can be held for 7 days prior to her surgery.  Please resume Aspirin  post operatively when it is felt to be safe from a bleeding standpoint.  (From office visit 02/17/24)  Will route this bundled recommendation to requesting provider via Epic fax function and remove from pre-op pool. Please call with questions.  Orren LOISE Fabry, PA-C 05/14/2024, 10:10 AM

## 2024-05-21 ENCOUNTER — Ambulatory Visit (HOSPITAL_BASED_OUTPATIENT_CLINIC_OR_DEPARTMENT_OTHER)
Admission: RE | Admit: 2024-05-21 | Discharge: 2024-05-21 | Disposition: A | Discharge status: Home / Self Care | Source: Ambulatory Visit | Attending: Family Medicine | Admitting: Family Medicine

## 2024-05-21 DIAGNOSIS — Z122 Encounter for screening for malignant neoplasm of respiratory organs: Secondary | ICD-10-CM | POA: Diagnosis not present

## 2024-05-21 DIAGNOSIS — Z87891 Personal history of nicotine dependence: Secondary | ICD-10-CM
